# Patient Record
Sex: Female | Born: 1945
Health system: Southern US, Community
[De-identification: ages and names within clinical notes are randomized; demographics above are authoritative.]

## PROBLEM LIST (undated history)

## (undated) DIAGNOSIS — K219 Gastro-esophageal reflux disease without esophagitis: Secondary | ICD-10-CM

## (undated) DIAGNOSIS — H9190 Unspecified hearing loss, unspecified ear: Secondary | ICD-10-CM

## (undated) DIAGNOSIS — M1990 Unspecified osteoarthritis, unspecified site: Secondary | ICD-10-CM

## (undated) DIAGNOSIS — I10 Essential (primary) hypertension: Secondary | ICD-10-CM

## (undated) DIAGNOSIS — J45909 Unspecified asthma, uncomplicated: Secondary | ICD-10-CM

## (undated) HISTORY — PX: EYE SURGERY: SHX253

## (undated) HISTORY — PX: GANGLION CYST EXCISION: SHX1691

## (undated) HISTORY — PX: FOOT SURGERY: SHX648

## (undated) HISTORY — PX: CHEST SURGERY: SHX595

---

## 1998-01-16 ENCOUNTER — Ambulatory Visit (HOSPITAL_COMMUNITY): Admission: RE | Admit: 1998-01-16 | Discharge: 1998-01-16 | Payer: Self-pay | Admitting: Obstetrics & Gynecology

## 1998-01-16 ENCOUNTER — Encounter: Admission: RE | Admit: 1998-01-16 | Discharge: 1998-01-16 | Payer: Self-pay | Admitting: Obstetrics & Gynecology

## 1998-05-08 ENCOUNTER — Encounter: Admission: RE | Admit: 1998-05-08 | Discharge: 1998-05-08 | Payer: Self-pay | Admitting: Obstetrics & Gynecology

## 1998-05-08 ENCOUNTER — Other Ambulatory Visit: Admission: RE | Admit: 1998-05-08 | Discharge: 1998-05-08 | Payer: Self-pay | Admitting: Obstetrics & Gynecology

## 2004-08-20 ENCOUNTER — Ambulatory Visit: Payer: Self-pay | Admitting: Internal Medicine

## 2005-08-22 ENCOUNTER — Ambulatory Visit: Payer: Self-pay | Admitting: Internal Medicine

## 2006-05-28 ENCOUNTER — Ambulatory Visit: Payer: Self-pay | Admitting: Internal Medicine

## 2006-08-26 ENCOUNTER — Ambulatory Visit: Payer: Self-pay | Admitting: Internal Medicine

## 2006-12-15 ENCOUNTER — Emergency Department: Payer: Self-pay | Admitting: Unknown Physician Specialty

## 2008-03-16 ENCOUNTER — Ambulatory Visit: Payer: Self-pay | Admitting: Internal Medicine

## 2008-06-12 ENCOUNTER — Ambulatory Visit: Payer: Self-pay | Admitting: Gastroenterology

## 2009-04-29 ENCOUNTER — Emergency Department: Payer: Self-pay | Admitting: Emergency Medicine

## 2009-10-16 ENCOUNTER — Ambulatory Visit: Payer: Self-pay | Admitting: Internal Medicine

## 2009-10-29 ENCOUNTER — Ambulatory Visit: Payer: Self-pay | Admitting: Internal Medicine

## 2012-03-11 ENCOUNTER — Emergency Department: Payer: Self-pay | Admitting: Internal Medicine

## 2012-03-11 IMAGING — CR DG LUMBAR SPINE 2-3V
1 series · 3 of 3 positions shown · non-contrast
Comparison: none

REASON FOR EXAM: R leg pain
COMMENTS:   LMP: Post-Menopausal

[Series 1: t lumbar spine ap · 0.14mm/px · 3 of 3 slices shown]
[im 1/3]
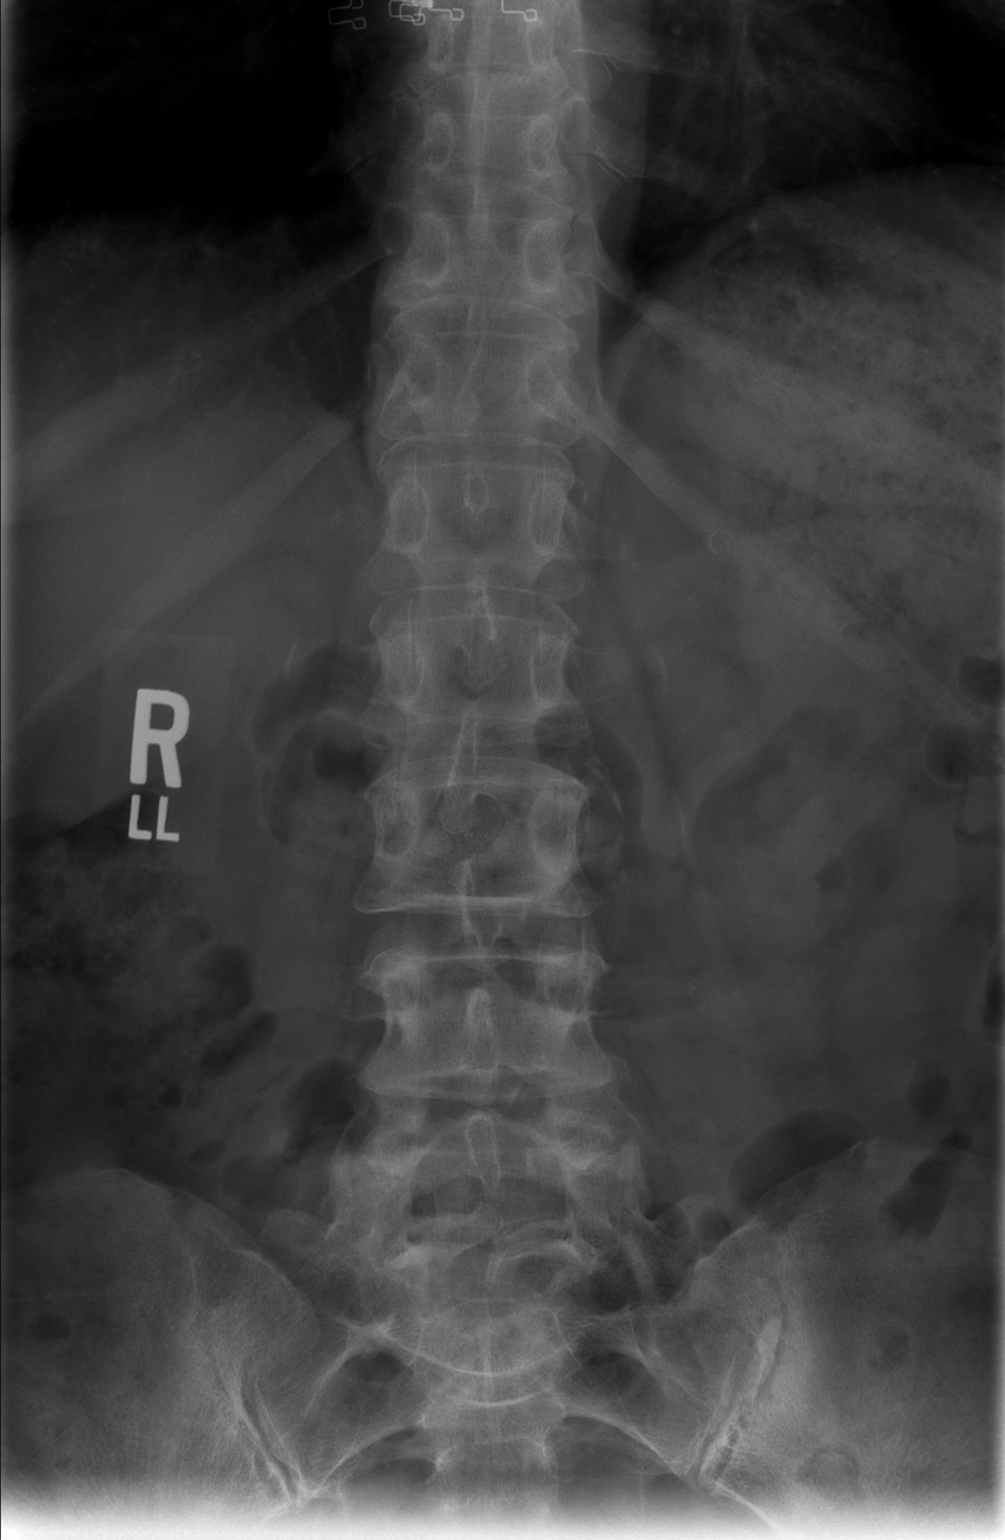
[im 2/3]
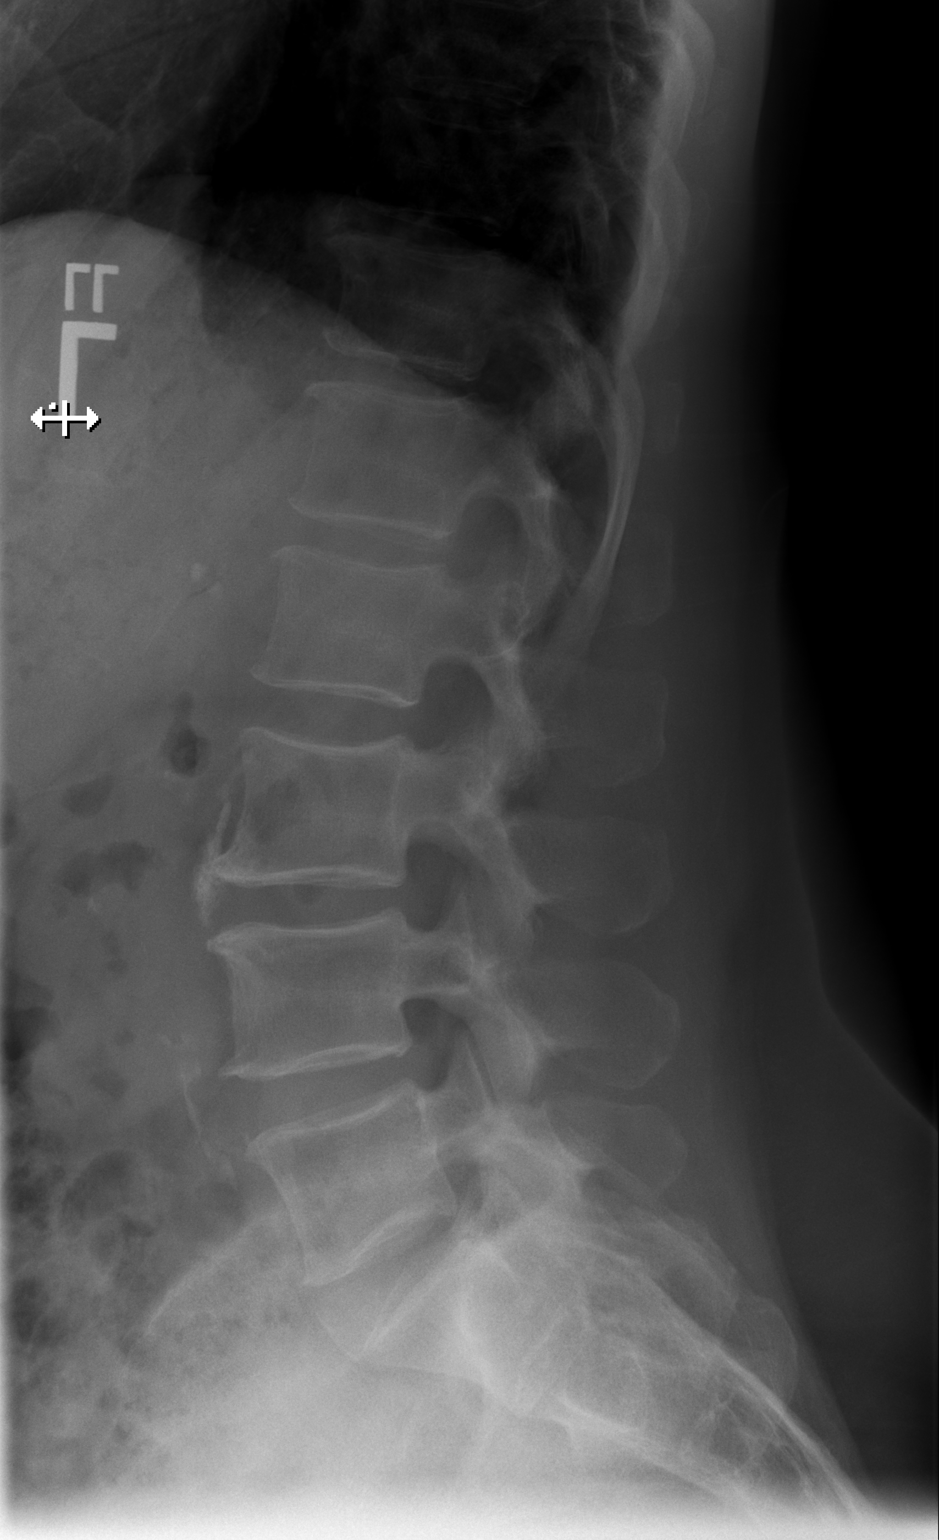
[im 3/3]
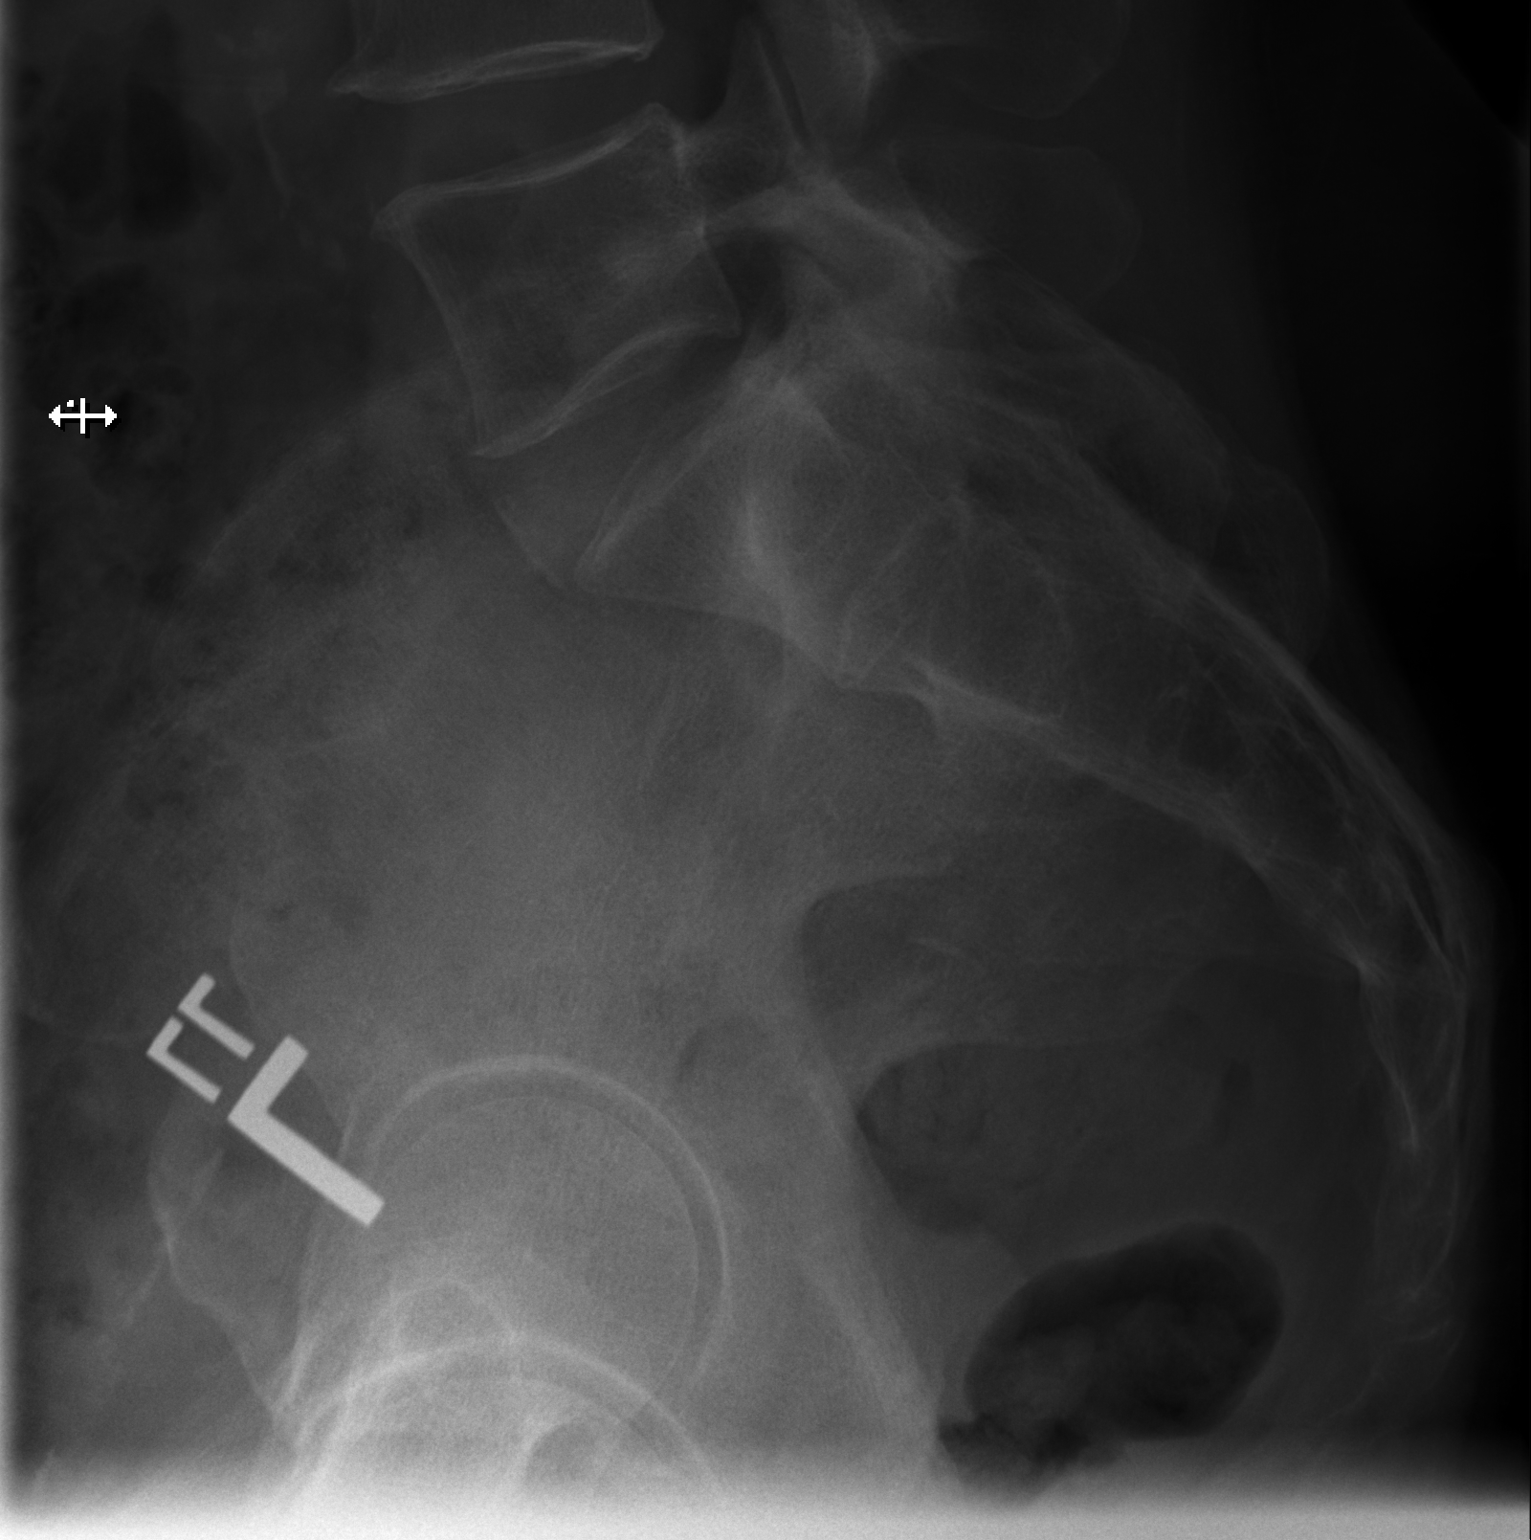

[3 of 3 positions shown; findings below may reference images not displayed]

PROCEDURE:     DXR - DXR LUMBAR SPINE AP AND LATERAL  - [DATE]  [DATE]

RESULT:     Lumbar spine images in the AP and lateral projections
demonstrate preservation of the vertebral body heights and intervertebral
disc spaces. The facets appear normally aligned. Atherosclerotic
calcification is noted in the aorta. Minimal degenerative endplate spurring
is seen at L3-L4 and L4-L5.
IMPRESSION: Mild degenerative change. No acute bony abnormality.

[REDACTED]

## 2012-03-22 ENCOUNTER — Ambulatory Visit: Payer: Self-pay | Admitting: Family Medicine

## 2012-09-07 ENCOUNTER — Ambulatory Visit: Payer: Self-pay | Admitting: Ophthalmology

## 2012-09-07 LAB — POTASSIUM: Potassium: 3.6 mmol/L (ref 3.5–5.1)

## 2012-09-15 ENCOUNTER — Ambulatory Visit: Payer: Self-pay | Admitting: Ophthalmology

## 2014-06-07 DIAGNOSIS — I10 Essential (primary) hypertension: Secondary | ICD-10-CM | POA: Diagnosis not present

## 2014-06-07 DIAGNOSIS — T148 Other injury of unspecified body region: Secondary | ICD-10-CM | POA: Diagnosis not present

## 2014-06-29 ENCOUNTER — Ambulatory Visit
Admit: 2014-06-29 | Disposition: A | Discharge status: Home / Self Care | Payer: Self-pay | Attending: Family Medicine | Admitting: Family Medicine

## 2014-06-29 DIAGNOSIS — Z1231 Encounter for screening mammogram for malignant neoplasm of breast: Secondary | ICD-10-CM | POA: Diagnosis not present

## 2014-07-14 NOTE — Op Note (Signed)
PATIENT NAME:  Holly Reyes, Holly Reyes MR#:  [id number] DATE OF BIRTH:  April 06, 1945  DATE OF PROCEDURE:  09/15/2012  PREOPERATIVE DIAGNOSIS:  Cataract, right eye.   POSTOPERATIVE DIAGNOSIS:  Cataract, right eye.  PROCEDURE PERFORMED:  Extracapsular cataract extraction using phacoemulsification with placement of an Alcon [id number], 20.5-diopter posterior chamber lens, serial # [id number].  SURGEON:  Loura Back. Roneshia Drew, MD  ASSISTANT:  None.  ANESTHESIA:  4% lidocaine and 0.75% Marcaine in a 50/50 mixture with 10 units per mL of Hylenex added, given as a peribulbar.  ANESTHESIOLOGIST:  Dr. Marcello Moores  COMPLICATIONS:  None  ESTIMATED BLOOD LOSS:  Less than 1 ml.  DESCRIPTION OF PROCEDURE:  The patient was brought to the operating room and given a peribulbar block.  The patient was then prepped and draped in the usual fashion.  The vertical rectus muscles were imbricated using 5-0 silk sutures.  These sutures were then clamped to the sterile drapes as bridle sutures.  A limbal peritomy was performed extending two clock hours and hemostasis was obtained with cautery.  A partial thickness scleral groove was made at the surgical limbus and dissected anteriorly in a lamellar dissection using an Alcon crescent knife.  The anterior chamber was entered superonasally with a Superblade and through the lamellar dissection with a 2.6 mm keratome.  DisCoVisc was used to replace the aqueous and a continuous tear capsulorrhexis was carried out.  Hydrodissection and hydrodelineation were carried out with balanced salt and a 27 gauge canula.  The nucleus was rotated to confirm the effectiveness of the hydrodissection.  Phacoemulsification was carried out using a divide-and-conquer technique.  Total ultrasound time was 1 minute and 22 seconds with an average power of 24.3%, CDE of 34.02.    Irrigation/aspiration was used to remove the residual cortex.  DisCoVisc was used to inflate the capsule and the internal incision was  enlarged to 3 mm with the crescent knife.  The intraocular lens was folded and inserted into the capsular bag using the AcrySert Delivery System.  Irrigation/aspiration was used to remove the residual DisCoVisc.  Miostat was injected into the anterior chamber through the paracentesis track to inflate the anterior chamber and induce miosis.  The wound was checked for leaks and none were found. The conjunctiva was closed with cautery and the bridle sutures were removed.  Two drops of 0.3% Vigamox were placed on the eye.   An eye shield was placed on the eye.  The patient was discharged to the recovery room in good condition.    ____________________________ Loura Back Mariaceleste Herrera, MD sad:ce D: 09/15/2012 11:40:37 ET T: 09/15/2012 12:50:02 ET JOB#: [id number]  cc: Remo Lipps A. Sumeya Yontz, MD, <Dictator> Martie Lee MD ELECTRONICALLY SIGNED 09/20/2012 13:52

## 2014-07-31 DIAGNOSIS — J069 Acute upper respiratory infection, unspecified: Secondary | ICD-10-CM | POA: Diagnosis not present

## 2014-07-31 DIAGNOSIS — I10 Essential (primary) hypertension: Secondary | ICD-10-CM | POA: Diagnosis not present

## 2015-02-12 DIAGNOSIS — E785 Hyperlipidemia, unspecified: Secondary | ICD-10-CM | POA: Diagnosis not present

## 2015-02-12 DIAGNOSIS — J309 Allergic rhinitis, unspecified: Secondary | ICD-10-CM | POA: Diagnosis not present

## 2015-02-12 DIAGNOSIS — Z23 Encounter for immunization: Secondary | ICD-10-CM | POA: Diagnosis not present

## 2015-02-12 DIAGNOSIS — I10 Essential (primary) hypertension: Secondary | ICD-10-CM | POA: Diagnosis not present

## 2016-04-08 ENCOUNTER — Other Ambulatory Visit: Payer: Self-pay | Admitting: Family Medicine

## 2016-04-08 DIAGNOSIS — Z0000 Encounter for general adult medical examination without abnormal findings: Secondary | ICD-10-CM

## 2016-04-08 DIAGNOSIS — Z1239 Encounter for other screening for malignant neoplasm of breast: Secondary | ICD-10-CM

## 2016-06-03 DIAGNOSIS — K635 Polyp of colon: Secondary | ICD-10-CM | POA: Insufficient documentation

## 2016-07-03 ENCOUNTER — Encounter: Payer: Self-pay | Admitting: *Deleted

## 2016-07-08 NOTE — H&P (Signed)
See scanned note.

## 2016-07-09 ENCOUNTER — Ambulatory Visit
Admission: RE | Admit: 2016-07-09 | Discharge: 2016-07-09 | Disposition: A | Discharge status: Home / Self Care | Payer: Medicare Other | Source: Ambulatory Visit | Attending: Ophthalmology | Admitting: Ophthalmology

## 2016-07-09 ENCOUNTER — Ambulatory Visit: Payer: Medicare Other | Admitting: Anesthesiology

## 2016-07-09 ENCOUNTER — Encounter: Payer: Self-pay | Admitting: *Deleted

## 2016-07-09 ENCOUNTER — Encounter
Admission: RE | Disposition: A | Discharge status: Home / Self Care | Payer: Self-pay | Source: Ambulatory Visit | Attending: Ophthalmology

## 2016-07-09 DIAGNOSIS — I10 Essential (primary) hypertension: Secondary | ICD-10-CM | POA: Insufficient documentation

## 2016-07-09 DIAGNOSIS — F17200 Nicotine dependence, unspecified, uncomplicated: Secondary | ICD-10-CM | POA: Diagnosis not present

## 2016-07-09 DIAGNOSIS — Z79899 Other long term (current) drug therapy: Secondary | ICD-10-CM | POA: Diagnosis not present

## 2016-07-09 DIAGNOSIS — J45909 Unspecified asthma, uncomplicated: Secondary | ICD-10-CM | POA: Insufficient documentation

## 2016-07-09 DIAGNOSIS — M1990 Unspecified osteoarthritis, unspecified site: Secondary | ICD-10-CM | POA: Diagnosis not present

## 2016-07-09 DIAGNOSIS — H25042 Posterior subcapsular polar age-related cataract, left eye: Secondary | ICD-10-CM | POA: Insufficient documentation

## 2016-07-09 DIAGNOSIS — K219 Gastro-esophageal reflux disease without esophagitis: Secondary | ICD-10-CM | POA: Insufficient documentation

## 2016-07-09 HISTORY — DX: Gastro-esophageal reflux disease without esophagitis: K21.9

## 2016-07-09 HISTORY — PX: CATARACT EXTRACTION W/PHACO: SHX586

## 2016-07-09 HISTORY — DX: Essential (primary) hypertension: I10

## 2016-07-09 HISTORY — DX: Unspecified asthma, uncomplicated: J45.909

## 2016-07-09 HISTORY — DX: Unspecified osteoarthritis, unspecified site: M19.90

## 2016-07-09 HISTORY — DX: Unspecified hearing loss, unspecified ear: H91.90

## 2016-07-09 SURGERY — PHACOEMULSIFICATION, CATARACT, WITH IOL INSERTION
Anesthesia: General | Site: Eye | Laterality: Left | Wound class: Clean

## 2016-07-09 MED ORDER — FENTANYL CITRATE (PF) 100 MCG/2ML IJ SOLN: 25.0000 ug | INTRAMUSCULAR | Status: DC | PRN

## 2016-07-09 MED ORDER — MIDAZOLAM HCL 2 MG/2ML IJ SOLN
INTRAMUSCULAR | Status: AC
  Filled 2016-07-09: qty 2

## 2016-07-09 MED ORDER — TRYPAN BLUE 0.06 % OP SOLN
OPHTHALMIC | Status: DC | PRN
  Administered 2016-07-09: 0.5 mL via INTRAOCULAR

## 2016-07-09 MED ORDER — CYCLOPENTOLATE HCL 2 % OP SOLN
1.0000 [drp] | OPHTHALMIC | Status: AC
  Administered 2016-07-09 (×4): 1 [drp] via OPHTHALMIC

## 2016-07-09 MED ORDER — MOXIFLOXACIN HCL 0.5 % OP SOLN
OPHTHALMIC | Status: AC
  Filled 2016-07-09: qty 3

## 2016-07-09 MED ORDER — CYCLOPENTOLATE HCL 2 % OP SOLN
OPHTHALMIC | Status: AC
  Administered 2016-07-09: 1 [drp] via OPHTHALMIC
  Filled 2016-07-09: qty 2

## 2016-07-09 MED ORDER — TETRACAINE HCL 0.5 % OP SOLN
OPHTHALMIC | Status: DC | PRN
  Administered 2016-07-09: 2 [drp] via OPHTHALMIC

## 2016-07-09 MED ORDER — MOXIFLOXACIN HCL 0.5 % OP SOLN
OPHTHALMIC | Status: AC
Start: 2016-07-09 — End: 2016-07-09
  Administered 2016-07-09: 1 [drp] via OPHTHALMIC
  Filled 2016-07-09: qty 3

## 2016-07-09 MED ORDER — TRYPAN BLUE 0.06 % OP SOLN
OPHTHALMIC | Status: AC
  Filled 2016-07-09: qty 0.5

## 2016-07-09 MED ORDER — MIDAZOLAM HCL 2 MG/2ML IJ SOLN
INTRAMUSCULAR | Status: DC | PRN
  Administered 2016-07-09: 0.5 mg via INTRAVENOUS
  Administered 2016-07-09: 1 mg via INTRAVENOUS

## 2016-07-09 MED ORDER — MOXIFLOXACIN HCL 0.5 % OP SOLN
OPHTHALMIC | Status: DC | PRN
  Administered 2016-07-09: 0.2 mL via OPHTHALMIC

## 2016-07-09 MED ORDER — PHENYLEPHRINE HCL 10 % OP SOLN
1.0000 [drp] | OPHTHALMIC | Status: AC
  Administered 2016-07-09 (×4): 1 [drp] via OPHTHALMIC

## 2016-07-09 MED ORDER — PHENYLEPHRINE HCL 10 % OP SOLN
OPHTHALMIC | Status: AC
Start: 2016-07-09 — End: 2016-07-09
  Administered 2016-07-09: 1 [drp] via OPHTHALMIC
  Filled 2016-07-09: qty 5

## 2016-07-09 MED ORDER — POVIDONE-IODINE 5 % OP SOLN
OPHTHALMIC | Status: DC | PRN
  Administered 2016-07-09: 1 via OPHTHALMIC

## 2016-07-09 MED ORDER — SODIUM CHLORIDE 0.9 % IV SOLN
INTRAVENOUS | Status: DC
  Administered 2016-07-09 (×2): via INTRAVENOUS

## 2016-07-09 MED ORDER — ALFENTANIL 500 MCG/ML IJ INJ
INJECTION | INTRAMUSCULAR | Status: DC | PRN
  Administered 2016-07-09: 500 ug via INTRAVENOUS

## 2016-07-09 MED ORDER — LIDOCAINE HCL (PF) 4 % IJ SOLN
INTRAMUSCULAR | Status: DC | PRN
  Administered 2016-07-09: 5 mL via OPHTHALMIC

## 2016-07-09 MED ORDER — LIDOCAINE HCL (PF) 4 % IJ SOLN
INTRAOCULAR | Status: DC | PRN
  Administered 2016-07-09: 4 mL via OPHTHALMIC

## 2016-07-09 MED ORDER — NA CHONDROIT SULF-NA HYALURON 40-17 MG/ML IO SOLN
INTRAOCULAR | Status: DC | PRN
  Administered 2016-07-09: 1 mL via INTRAOCULAR

## 2016-07-09 MED ORDER — EPINEPHRINE PF 1 MG/ML IJ SOLN
INTRAOCULAR | Status: DC | PRN
  Administered 2016-07-09: 200 mL via OPHTHALMIC

## 2016-07-09 MED ORDER — ONDANSETRON HCL 4 MG/2ML IJ SOLN: 4.0000 mg | Freq: Once | INTRAMUSCULAR | Status: DC | PRN

## 2016-07-09 MED ORDER — MOXIFLOXACIN HCL 0.5 % OP SOLN
1.0000 [drp] | OPHTHALMIC | Status: AC
  Administered 2016-07-09 (×3): 1 [drp] via OPHTHALMIC

## 2016-07-09 SURGICAL SUPPLY — 32 items
CANNULA ANT/CHMB 27G (MISCELLANEOUS) ×1 IMPLANT
CANNULA ANT/CHMB 27GA (MISCELLANEOUS) ×9 IMPLANT
CORD BIP STRL DISP 12FT (MISCELLANEOUS) ×3 IMPLANT
CUP MEDICINE 2OZ PLAST GRAD ST (MISCELLANEOUS) ×3 IMPLANT
DRAPE XRAY CASSETTE 23X24 (DRAPES) ×3 IMPLANT
ERASER HMR WETFIELD 18G (MISCELLANEOUS) ×3 IMPLANT
FILTER MILLEX .045 (MISCELLANEOUS) IMPLANT
FLTR MILLEX .045 (MISCELLANEOUS) ×3
GLOVE BIO SURGEON STRL SZ8 (GLOVE) ×3 IMPLANT
GLOVE SURG LX 6.5 MICRO (GLOVE) ×2
GLOVE SURG LX 8.0 MICRO (GLOVE) ×2
GLOVE SURG LX STRL 6.5 MICRO (GLOVE) ×1 IMPLANT
GLOVE SURG LX STRL 8.0 MICRO (GLOVE) ×1 IMPLANT
GOWN STRL REUS W/ TWL LRG LVL3 (GOWN DISPOSABLE) ×1 IMPLANT
GOWN STRL REUS W/ TWL XL LVL3 (GOWN DISPOSABLE) ×1 IMPLANT
GOWN STRL REUS W/TWL LRG LVL3 (GOWN DISPOSABLE) ×3
GOWN STRL REUS W/TWL XL LVL3 (GOWN DISPOSABLE) ×3
LENS IOL ACRYSOF IQ 21.0 (Intraocular Lens) ×2 IMPLANT
PACK CATARACT (MISCELLANEOUS) ×3 IMPLANT
PACK CATARACT DINGLEDEIN LX (MISCELLANEOUS) ×3 IMPLANT
PACK EYE AFTER SURG (MISCELLANEOUS) ×3 IMPLANT
SHLD EYE VISITEC  UNIV (MISCELLANEOUS) ×3 IMPLANT
SOL BSS BAG (MISCELLANEOUS) ×3
SOL PREP PVP 2OZ (MISCELLANEOUS) ×3
SOLUTION BSS BAG (MISCELLANEOUS) ×1 IMPLANT
SOLUTION PREP PVP 2OZ (MISCELLANEOUS) ×1 IMPLANT
SUT SILK 5-0 (SUTURE) ×3 IMPLANT
SYR 3ML LL SCALE MARK (SYRINGE) ×5 IMPLANT
SYR 5ML LL (SYRINGE) ×3 IMPLANT
SYR TB 1ML 27GX1/2 LL (SYRINGE) ×3 IMPLANT
WATER STERILE IRR 250ML POUR (IV SOLUTION) ×3 IMPLANT
WIPE NON LINTING 3.25X3.25 (MISCELLANEOUS) ×3 IMPLANT

## 2016-07-09 NOTE — Transfer of Care (Signed)
Immediate Anesthesia Transfer of Care Note  Patient: Holly Reyes  Procedure(s) Performed: Procedure(s) with comments: CATARACT EXTRACTION PHACO AND INTRAOCULAR LENS PLACEMENT (IOC) (Left) - Lot# [id number] H [place]: 01:58.7 AP%: 24.0 CDE: 48.83  Patient Location: PACU and Short Stay  Anesthesia Type:MAC  Level of Consciousness: awake, oriented and patient cooperative  Airway & Oxygen Therapy: Patient Spontanous Breathing  Post-op Assessment: Report given to RN and Post -op Vital signs reviewed and stable  Post vital signs: Reviewed and stable  Last Vitals:  Vitals:   07/09/16 0755  BP: (!) 184/77  Pulse: 66  Resp: 18  Temp: 36.2 C    Last Pain:  Vitals:   07/09/16 0755  TempSrc: Tympanic         Complications: No apparent anesthesia complications

## 2016-07-09 NOTE — Anesthesia Preprocedure Evaluation (Signed)
Anesthesia Evaluation  Patient identified by MRN, date of birth, ID band Patient awake    Reviewed: Allergy & Precautions, NPO status , Patient's Chart, lab work & pertinent test results  Airway Mallampati: II  TM Distance: >3 FB     Dental   Pulmonary asthma , Current Smoker,    Pulmonary exam normal        Cardiovascular hypertension, Pt. on medications Normal cardiovascular exam     Neuro/Psych negative neurological ROS  negative psych ROS   GI/Hepatic Neg liver ROS, GERD  ,  Endo/Other  negative endocrine ROS  Renal/GU negative Renal ROS  negative genitourinary   Musculoskeletal  (+) Arthritis , Osteoarthritis,    Abdominal Normal abdominal exam  (+)   Peds negative pediatric ROS (+)  Hematology negative hematology ROS (+)   Anesthesia Other Findings   Reproductive/Obstetrics                             Anesthesia Physical Anesthesia Plan  ASA: II  Anesthesia Plan: General   Post-op Pain Management:    Induction: Intravenous  Airway Management Planned: Nasal Cannula  Additional Equipment:   Intra-op Plan:   Post-operative Plan:   Informed Consent: I have reviewed the patients History and Physical, chart, labs and discussed the procedure including the risks, benefits and alternatives for the proposed anesthesia with the patient or authorized representative who has indicated his/her understanding and acceptance.     Plan Discussed with: CRNA and Surgeon  Anesthesia Plan Comments:         Anesthesia Quick Evaluation

## 2016-07-09 NOTE — Anesthesia Post-op Follow-up Note (Cosign Needed)
Anesthesia QCDR form completed.        

## 2016-07-09 NOTE — Op Note (Signed)
Date of Surgery: 07/09/2016 Date of Dictation: 07/09/2016 10:22 AM Pre-operative Diagnosis:Nuclear Sclerotic Cataract, Posterior Subcapsular Cataract and Cortical Cataract left Eye Post-operative Diagnosis: same Procedure performed: Extra-capsular Cataract Extraction (ECCE) with placement of a posterior chamber intraocular lens (IOL) left Eye IOL:  Implant Name Type Inv. Item Serial No. Manufacturer Lot No. LRB No. Used  LENS IOL ACRYSOF IQ 21.0 - [id number] Intraocular Lens LENS IOL ACRYSOF IQ 21.0 [id number] ALCON   Left 1   Anesthesia: 2% Lidocaine and 4% Marcaine in a 50/50 mixture with 10 unites/ml of Hylenex given as a peribulbar Anesthesiologist: Anesthesiologist: Alvin Critchley, MD CRNA: Courtney Paris, CRNA Complications: none Estimated Blood Loss: less than 1 ml  Description of procedure:  The patient was given anesthesia and sedation via intravenous access. The patient was then prepped and draped in the usual fashion. A 25-gauge needle was bent for initiating the capsulorhexis. A 5-0 silk suture was placed through the conjunctiva superior and inferiorly to serve as bridle sutures. Hemostasis was obtained at the superior limbus using an eraser cautery. A partial thickness groove was made at the anterior surgical limbus with a 64 Beaver blade and this was dissected anteriorly with an [employer]. The anterior chamber was entered at 10 o'clock and 2 o'clock with a 1.0 mm paracentesis knife. Epi-Shugarcaine 0.5 CC [9 cc BSS Plus (Alcon), 3 cc 4% preservative-free lidocaine (Hospira) and 4 cc 1:1000 preservative-free, bisulfite-free epinephrine] was injected into the anterior chamber via the paracentesis tract. Air was introduced via the paracentesis to replace the aqueous and Vision Blue was used to paint the surface of the anterior capsule. The air was then replaced with DiscoVisc.   The anterior chamber was entered through the lamellar dissection with a 2.6 mm Alcon keratome.A  continuous tear curvilinear capsulorhexis was performed using a bent 25-gauge needle.  Balance salt on a syringe was used to perform hydro-dissection and phacoemulsification was carried out using a divide and conquer technique. Procedure(s) with comments: CATARACT EXTRACTION PHACO AND INTRAOCULAR LENS PLACEMENT (IOC) (Left) - Lot# [id number] H [place]: 01:58.7 AP%: 24.0 CDE: 48.83. Irrigation/aspiration was used to remove the residual cortex and the capsular bag was inflated with DiscoVisc. Irrigation/aspiration was used to remove the residual cortex and the capsular bag was inflated with DiscoVisc. The intraocular lens was inserted into the capsular bag using a pre-loaded UltraSert Delivery System. Irrigation/aspiration was used to remove the residual DiscoVisc. The wound was inflated with balanced salt and checked for leaks. None were found. Miostat was injected via the paracentesis track and 0.1 ml of Vigamox containing 1 mg of drug  was injected via the paracentesis track. The wound was checked for leaks again and none were found.   The bridal sutures were removed and two drops of Vigamox were placed on the eye. An eye shield was placed to protect the eye and the patient was discharged to the recovery area in good condition.   Cloee Dunwoody MD

## 2016-07-09 NOTE — Interval H&P Note (Signed)
History and Physical Interval Note:  07/09/2016 9:31 AM  Holly Reyes  has presented today for surgery, with the diagnosis of CATARACT  The various methods of treatment have been discussed with the patient and family. After consideration of risks, benefits and other options for treatment, the patient has consented to  Procedure(s): CATARACT EXTRACTION PHACO AND INTRAOCULAR LENS PLACEMENT ([place]) (Left) as a surgical intervention .  The patient's history has been reviewed, patient examined, no change in status, stable for surgery.  I have reviewed the patient's chart and labs.  Questions were answered to the patient's satisfaction.     Joby Hershkowitz

## 2016-07-09 NOTE — Discharge Instructions (Signed)
Eye Surgery Discharge Instructions  Expect mild scratchy sensation or mild soreness. DO NOT RUB YOUR EYE!  The day of surgery:  Minimal physical activity, but bed rest is not required  No reading, computer work, or close hand work  No bending, lifting, or straining.  May watch TV  For 24 hours:  No driving, legal decisions, or alcoholic beverages  Safety precautions  Eat anything you prefer: It is better to start with liquids, then soup then solid foods.  _____ Eye patch should be worn until postoperative exam tomorrow.  ____ Solar shield eyeglasses should be worn for comfort in the sunlight/patch while sleeping  Resume all regular medications including aspirin or Coumadin if these were discontinued prior to surgery. You may shower, bathe, shave, or wash your hair. Tylenol may be taken for mild discomfort.  Call your doctor if you experience significant pain, nausea, or vomiting, fever > 101 or other signs of infection. [phone] or [phone] Specific instructions:  Follow-up Information    Trenton Verne, MD Follow up.   Specialty:  Ophthalmology Why:  April 19 at 10:40am Contact information: [address]   [place] [address] [phone]

## 2016-07-10 NOTE — Anesthesia Postprocedure Evaluation (Signed)
Anesthesia Post Note  Patient: Holly Reyes  Procedure(s) Performed: Procedure(s) (LRB): CATARACT EXTRACTION PHACO AND INTRAOCULAR LENS PLACEMENT (IOC) (Left)  Patient location during evaluation: PACU Anesthesia Type: General Level of consciousness: awake and alert and oriented Pain management: pain level controlled Vital Signs Assessment: post-procedure vital signs reviewed and stable Respiratory status: spontaneous breathing Cardiovascular status: blood pressure returned to baseline Anesthetic complications: no     Last Vitals:  Vitals:   07/09/16 1022 07/09/16 1025  BP: (!) 175/70 (!) 167/68  Pulse: 74 73  Resp:  18  Temp: 36.3 C 36.3 C    Last Pain:  Vitals:   07/10/16 0847  TempSrc:   PainSc: 0-No pain                 Janiene Aarons

## 2017-02-02 ENCOUNTER — Ambulatory Visit: Payer: Medicare Other | Admitting: Anesthesiology

## 2017-02-02 ENCOUNTER — Encounter
Admission: RE | Disposition: A | Discharge status: Home / Self Care | Payer: Self-pay | Source: Ambulatory Visit | Attending: Gastroenterology

## 2017-02-02 ENCOUNTER — Encounter: Payer: Self-pay | Admitting: *Deleted

## 2017-02-02 ENCOUNTER — Ambulatory Visit
Admission: RE | Admit: 2017-02-02 | Discharge: 2017-02-02 | Disposition: A | Discharge status: Home / Self Care | Payer: Medicare Other | Source: Ambulatory Visit | Attending: Gastroenterology | Admitting: Gastroenterology

## 2017-02-02 DIAGNOSIS — J45909 Unspecified asthma, uncomplicated: Secondary | ICD-10-CM | POA: Insufficient documentation

## 2017-02-02 DIAGNOSIS — K640 First degree hemorrhoids: Secondary | ICD-10-CM | POA: Insufficient documentation

## 2017-02-02 DIAGNOSIS — D125 Benign neoplasm of sigmoid colon: Secondary | ICD-10-CM | POA: Diagnosis not present

## 2017-02-02 DIAGNOSIS — Z1211 Encounter for screening for malignant neoplasm of colon: Secondary | ICD-10-CM | POA: Insufficient documentation

## 2017-02-02 DIAGNOSIS — K635 Polyp of colon: Secondary | ICD-10-CM | POA: Diagnosis not present

## 2017-02-02 DIAGNOSIS — K5730 Diverticulosis of large intestine without perforation or abscess without bleeding: Secondary | ICD-10-CM | POA: Diagnosis not present

## 2017-02-02 DIAGNOSIS — K219 Gastro-esophageal reflux disease without esophagitis: Secondary | ICD-10-CM | POA: Insufficient documentation

## 2017-02-02 DIAGNOSIS — Z79899 Other long term (current) drug therapy: Secondary | ICD-10-CM | POA: Insufficient documentation

## 2017-02-02 DIAGNOSIS — I10 Essential (primary) hypertension: Secondary | ICD-10-CM | POA: Diagnosis not present

## 2017-02-02 DIAGNOSIS — M1990 Unspecified osteoarthritis, unspecified site: Secondary | ICD-10-CM | POA: Diagnosis not present

## 2017-02-02 HISTORY — PX: COLONOSCOPY WITH PROPOFOL: SHX5780

## 2017-02-02 SURGERY — COLONOSCOPY WITH PROPOFOL
Anesthesia: General

## 2017-02-02 MED ORDER — SODIUM CHLORIDE 0.9 % IV SOLN: INTRAVENOUS | Status: DC

## 2017-02-02 MED ORDER — PROPOFOL 10 MG/ML IV BOLUS
INTRAVENOUS | Status: DC | PRN
  Administered 2017-02-02: 70 mg via INTRAVENOUS
  Administered 2017-02-02: 30 mg via INTRAVENOUS

## 2017-02-02 MED ORDER — PROPOFOL 10 MG/ML IV BOLUS
INTRAVENOUS | Status: AC
  Filled 2017-02-02: qty 40

## 2017-02-02 MED ORDER — IPRATROPIUM-ALBUTEROL 0.5-2.5 (3) MG/3ML IN SOLN
RESPIRATORY_TRACT | Status: AC
  Administered 2017-02-02: 3 mL via RESPIRATORY_TRACT
  Filled 2017-02-02: qty 3

## 2017-02-02 MED ORDER — LIDOCAINE HCL (CARDIAC) 20 MG/ML IV SOLN
INTRAVENOUS | Status: DC | PRN
  Administered 2017-02-02: 50 mg via INTRAVENOUS

## 2017-02-02 MED ORDER — IPRATROPIUM-ALBUTEROL 0.5-2.5 (3) MG/3ML IN SOLN
3.0000 mL | Freq: Four times a day (QID) | RESPIRATORY_TRACT | Status: DC | PRN
  Administered 2017-02-02: 3 mL via RESPIRATORY_TRACT

## 2017-02-02 MED ORDER — PROPOFOL 10 MG/ML IV BOLUS
INTRAVENOUS | Status: AC
  Filled 2017-02-02: qty 20

## 2017-02-02 MED ORDER — PROPOFOL 500 MG/50ML IV EMUL
INTRAVENOUS | Status: DC | PRN
  Administered 2017-02-02: 75 ug/kg/min via INTRAVENOUS

## 2017-02-02 MED ORDER — LIDOCAINE HCL (PF) 2 % IJ SOLN
INTRAMUSCULAR | Status: AC
  Filled 2017-02-02: qty 10

## 2017-02-02 MED ORDER — SODIUM CHLORIDE 0.9 % IV SOLN
INTRAVENOUS | Status: DC
  Administered 2017-02-02: 1000 mL via INTRAVENOUS

## 2017-02-02 NOTE — Anesthesia Preprocedure Evaluation (Signed)
Anesthesia Evaluation  Patient identified by MRN, date of birth, ID band Patient awake    Reviewed: Allergy & Precautions, NPO status , Patient's Chart, lab work & pertinent test results  History of Anesthesia Complications Negative for: history of anesthetic complications  Airway Mallampati: II  TM Distance: >3 FB Neck ROM: Full    Dental no notable dental hx.    Pulmonary neg sleep apnea, neg COPD, Current Smoker,    breath sounds clear to auscultation- rhonchi (-) wheezing      Cardiovascular Exercise Tolerance: Good hypertension, Pt. on medications (-) CAD, (-) Past MI and (-) Cardiac Stents  Rhythm:Regular Rate:Normal - Systolic murmurs and - Diastolic murmurs    Neuro/Psych negative neurological ROS  negative psych ROS   GI/Hepatic Neg liver ROS, GERD  ,  Endo/Other  negative endocrine ROSneg diabetes  Renal/GU negative Renal ROS     Musculoskeletal  (+) Arthritis ,   Abdominal (+) + obese,   Peds  Hematology negative hematology ROS (+)   Anesthesia Other Findings Past Medical History: No date: Arthritis No date: Asthma No date: GERD (gastroesophageal reflux disease) No date: HOH (hard of hearing) No date: Hypertension   Reproductive/Obstetrics                             Anesthesia Physical Anesthesia Plan  ASA: II  Anesthesia Plan: General   Post-op Pain Management:    Induction: Intravenous  PONV Risk Score and Plan: 2 and Propofol infusion  Airway Management Planned: Natural Airway  Additional Equipment:   Intra-op Plan:   Post-operative Plan:   Informed Consent: I have reviewed the patients History and Physical, chart, labs and discussed the procedure including the risks, benefits and alternatives for the proposed anesthesia with the patient or authorized representative who has indicated his/her understanding and acceptance.   Dental advisory given  Plan  Discussed with: CRNA and Anesthesiologist  Anesthesia Plan Comments:         Anesthesia Quick Evaluation

## 2017-02-02 NOTE — H&P (Signed)
Outpatient short stay form Pre-procedure 02/02/2017 2:39 PM Lollie Sails MD  Primary Physician: [place]  Reason for visit:  Colonoscopy  History of present illness:  Patient is a 71 year old female presenting today as above. This for a screening colonoscopy. Her last colonoscopy was about 2010. She has no problems with rectal bleeding abdominal pain or diarrhea. She tolerated her prep well. She takes no aspirin or blood thinning agents.    Current Facility-Administered Medications:  .  0.9 %  sodium chloride infusion, , Intravenous, Continuous, Lollie Sails, MD, Last Rate: 20 mL/hr at 02/02/17 1309, 1,000 mL at 02/02/17 1309 .  0.9 %  sodium chloride infusion, , Intravenous, Continuous, Lollie Sails, MD .  ipratropium-albuterol (DUONEB) 0.5-2.5 (3) MG/3ML nebulizer solution 3 mL, 3 mL, Nebulization, Q6H PRN, [place], Amy, MD, 3 mL at 02/02/17 1335  Medications Prior to Admission  Medication Sig Dispense Refill Last Dose  . AMLODIPINE BESYLATE PO Take 10 mg by mouth daily.   Past Week at Unknown time  . atorvastatin (LIPITOR) 20 MG tablet Take 20 mg by mouth daily.   Past Week at Unknown time  . losartan (COZAAR) 100 MG tablet Take 100 mg by mouth daily.   Past Week at Unknown time     Allergies  Allergen Reactions  . Penicillins Rash     Past Medical History:  Diagnosis Date  . Arthritis   . Asthma   . GERD (gastroesophageal reflux disease)   . HOH (hard of hearing)   . Hypertension     Review of systems:      Physical Exam    Heart and lungs: Regular rate and rhythm without rub or gallop, lungs are bilaterally clear.    HEENT: Normocephalic atraumatic eyes are anicteric    Other:     Pertinant exam for procedure: Soft nontender nondistended bowel sounds positive normoactive.    Planned proceedures: Colonoscopy and indicated procedures. I have discussed the risks benefits and complications of procedures to include  not limited to bleeding, infection, perforation and the risk of sedation and the patient wishes to proceed.    Lollie Sails, MD Gastroenterology 02/02/2017  2:39 PM

## 2017-02-02 NOTE — Anesthesia Procedure Notes (Signed)
Date/Time: 02/02/2017 3:13 PM Performed by: Johnna Acosta, CRNA Pre-anesthesia Checklist: Patient identified, Emergency Drugs available, Suction available, Patient being monitored and Timeout performed Patient Re-evaluated:Patient Re-evaluated prior to induction Oxygen Delivery Method: Nasal cannula Preoxygenation: Pre-oxygenation with 100% oxygen

## 2017-02-02 NOTE — Op Note (Addendum)
[facility] Patient Name: Holly Reyes Procedure Date: 02/02/2017 2:44 PM MRN: [id number] Account #: [id number] Date of Birth: September 14, 1945 Admit Type: Outpatient Age: 71 Room: [facility] ENDO ROOM 3 Gender: Female Note Status: Finalized Procedure:            Colonoscopy Indications:          Screening for colorectal malignant neoplasm Providers:            Lollie Sails, MD Referring MD:         Baxter Kail. Rebeca Alert MD, MD (Referring MD) Medicines:            Monitored Anesthesia Care Complications:        No immediate complications. Procedure:            Pre-Anesthesia Assessment:                       - ASA Grade Assessment: II - A patient with mild                        systemic disease.                       After obtaining informed consent, the colonoscope was                        passed under direct vision. Throughout the procedure,                        the patient's blood pressure, pulse, and oxygen                        saturations were monitored continuously. The                        Colonoscope was introduced through the anus and                        advanced to the the cecum, identified by appendiceal                        orifice and ileocecal valve. The colonoscopy was                        unusually difficult due to poor bowel prep. Successful                        completion of the procedure was aided by lavage. The                        patient tolerated the procedure well. The quality of                        the bowel preparation was fair. The quality of the                        bowel preparation was fair except the ascending colon                        was poor. Findings:      A 2 mm polyp was found in the  appendiceal orifice. The polyp was       sessile. The polyp was removed with a cold biopsy forceps. Resection and       retrieval were complete.      Four sessile polyps were found in the sigmoid colon. The polyps  were 1       to 3 mm in size. These polyps were removed with a cold biopsy forceps.       Resection and retrieval were complete.      Multiple medium-mouthed diverticula were found in the sigmoid colon and       descending colon.      The digital rectal exam was normal.      Non-bleeding internal hemorrhoids were found during anoscopy. The       hemorrhoids were small and Grade I (internal hemorrhoids that do not       prolapse). Impression:           - Preparation of the colon was fair.                       - One 2 mm polyp at the appendiceal orifice, removed                        with a cold biopsy forceps. Resected and retrieved.                       - Four 1 to 3 mm polyps in the sigmoid colon, removed                        with a cold biopsy forceps. Resected and retrieved.                       - Diverticulosis in the sigmoid colon and in the                        descending colon. Recommendation:       - Discharge patient to home.                       - Await pathology results.                       - Await pathology results.                       - Telephone GI clinic for pathology results in 1 week. Procedure Code(s):    --- Professional ---                       [phone], Colonoscopy, flexible; with biopsy, single or                        multiple Diagnosis Code(s):    --- Professional ---                       Z12.11, Encounter for screening for malignant neoplasm                        of colon                       D12.1, Benign neoplasm of  appendix                       D12.5, Benign neoplasm of sigmoid colon                       K57.30, Diverticulosis of large intestine without                        perforation or abscess without bleeding CPT copyright 2016 American Medical Association. All rights reserved. The codes documented in this report are preliminary and upon coder review may  be revised to meet current compliance requirements. Lollie Sails,  MD 02/02/2017 3:37:10 PM This report has been signed electronically. Number of Addenda: 0 Note Initiated On: 02/02/2017 2:44 PM Scope Withdrawal Time: 0 hours 20 minutes 12 seconds  Total Procedure Duration: 0 hours 31 minutes 5 seconds       [facility]

## 2017-02-02 NOTE — Anesthesia Post-op Follow-up Note (Signed)
Anesthesia QCDR form completed.        

## 2017-02-02 NOTE — Transfer of Care (Signed)
Immediate Anesthesia Transfer of Care Note  Patient: Edison Nasuti  Procedure(s) Performed: COLONOSCOPY WITH PROPOFOL (N/A )  Patient Location: PACU  Anesthesia Type:General  Level of Consciousness: awake and alert   Airway & Oxygen Therapy: Patient Spontanous Breathing and Patient connected to nasal cannula oxygen  Post-op Assessment: Report given to RN and Post -op Vital signs reviewed and stable  Post vital signs: Reviewed and stable  Last Vitals:  Vitals:   02/02/17 1246 02/02/17 1539  BP: (!) 186/77 130/77  Pulse: 92 78  Resp: 20 18  Temp: 36.9 C (!) 36.1 C  SpO2:  99%    Last Pain:  Vitals:   02/02/17 1539  TempSrc: Tympanic      Patients Stated Pain Goal: 0 (21/58/72 7618)  Complications: No apparent anesthesia complications

## 2017-02-03 ENCOUNTER — Encounter: Payer: Self-pay | Admitting: Gastroenterology

## 2017-02-03 NOTE — Anesthesia Postprocedure Evaluation (Signed)
Anesthesia Post Note  Patient: Holly Reyes  Procedure(s) Performed: COLONOSCOPY WITH PROPOFOL (N/A )  Patient location during evaluation: Endoscopy Anesthesia Type: General Level of consciousness: awake and alert and oriented Pain management: pain level controlled Vital Signs Assessment: post-procedure vital signs reviewed and stable Respiratory status: spontaneous breathing, nonlabored ventilation and respiratory function stable Cardiovascular status: blood pressure returned to baseline and stable Postop Assessment: no signs of nausea or vomiting Anesthetic complications: no     Last Vitals:  Vitals:   02/02/17 1539 02/02/17 1607  BP: 130/77 (!) 177/87  Pulse: 78   Resp: 18   Temp: (!) 36.1 C   SpO2: 99%     Last Pain:  Vitals:   02/02/17 1539  TempSrc: Tympanic                 Sibyl Mikula

## 2017-02-04 LAB — SURGICAL PATHOLOGY

## 2017-11-09 ENCOUNTER — Other Ambulatory Visit: Payer: Self-pay | Admitting: Nurse Practitioner

## 2017-11-09 DIAGNOSIS — E2839 Other primary ovarian failure: Secondary | ICD-10-CM

## 2017-11-09 DIAGNOSIS — Z1231 Encounter for screening mammogram for malignant neoplasm of breast: Secondary | ICD-10-CM

## 2017-12-24 ENCOUNTER — Ambulatory Visit (INDEPENDENT_AMBULATORY_CARE_PROVIDER_SITE_OTHER): Payer: Medicare Other | Admitting: Vascular Surgery

## 2017-12-24 ENCOUNTER — Encounter (INDEPENDENT_AMBULATORY_CARE_PROVIDER_SITE_OTHER): Payer: Self-pay | Admitting: Vascular Surgery

## 2017-12-24 VITALS — BP 165/71 | HR 86 | Resp 16 | Ht 67.5 in | Wt 217.0 lb

## 2017-12-24 DIAGNOSIS — M79605 Pain in left leg: Secondary | ICD-10-CM

## 2017-12-24 DIAGNOSIS — I10 Essential (primary) hypertension: Secondary | ICD-10-CM | POA: Insufficient documentation

## 2017-12-24 DIAGNOSIS — M79604 Pain in right leg: Secondary | ICD-10-CM | POA: Insufficient documentation

## 2017-12-24 DIAGNOSIS — R600 Localized edema: Secondary | ICD-10-CM | POA: Diagnosis not present

## 2017-12-24 DIAGNOSIS — F17210 Nicotine dependence, cigarettes, uncomplicated: Secondary | ICD-10-CM

## 2017-12-24 DIAGNOSIS — L03116 Cellulitis of left lower limb: Secondary | ICD-10-CM | POA: Diagnosis not present

## 2017-12-24 DIAGNOSIS — E785 Hyperlipidemia, unspecified: Secondary | ICD-10-CM

## 2017-12-24 MED ORDER — DOXYCYCLINE HYCLATE 100 MG PO CAPS: 100.0000 mg | ORAL_CAPSULE | Freq: Every day | ORAL | 0 refills | Status: AC
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
## 2018-01-14 ENCOUNTER — Ambulatory Visit
Admission: RE | Admit: 2018-01-14 | Discharge: 2018-01-14 | Disposition: A | Discharge status: Home / Self Care | Payer: Medicare Other | Source: Ambulatory Visit | Attending: Nurse Practitioner | Admitting: Nurse Practitioner

## 2018-01-14 ENCOUNTER — Other Ambulatory Visit: Payer: Self-pay | Admitting: Nurse Practitioner

## 2018-01-14 DIAGNOSIS — E2839 Other primary ovarian failure: Secondary | ICD-10-CM | POA: Diagnosis present

## 2018-01-14 DIAGNOSIS — Z1231 Encounter for screening mammogram for malignant neoplasm of breast: Secondary | ICD-10-CM | POA: Insufficient documentation

## 2018-01-18 ENCOUNTER — Other Ambulatory Visit: Payer: Self-pay | Admitting: Nurse Practitioner

## 2018-01-18 DIAGNOSIS — R928 Other abnormal and inconclusive findings on diagnostic imaging of breast: Secondary | ICD-10-CM

## 2018-01-27 ENCOUNTER — Ambulatory Visit
Admission: RE | Admit: 2018-01-27 | Discharge: 2018-01-27 | Disposition: A | Discharge status: Home / Self Care | Payer: Medicare Other | Source: Ambulatory Visit | Attending: Nurse Practitioner | Admitting: Nurse Practitioner

## 2018-01-27 DIAGNOSIS — R928 Other abnormal and inconclusive findings on diagnostic imaging of breast: Secondary | ICD-10-CM | POA: Diagnosis not present

## 2018-02-01 ENCOUNTER — Ambulatory Visit (INDEPENDENT_AMBULATORY_CARE_PROVIDER_SITE_OTHER): Payer: Medicare Other | Admitting: Vascular Surgery

## 2018-02-01 ENCOUNTER — Encounter (INDEPENDENT_AMBULATORY_CARE_PROVIDER_SITE_OTHER): Payer: Self-pay

## 2018-02-01 ENCOUNTER — Encounter (INDEPENDENT_AMBULATORY_CARE_PROVIDER_SITE_OTHER): Payer: Self-pay | Admitting: Vascular Surgery

## 2018-02-01 ENCOUNTER — Ambulatory Visit (INDEPENDENT_AMBULATORY_CARE_PROVIDER_SITE_OTHER): Payer: Medicare Other

## 2018-02-01 VITALS — BP 193/86 | HR 84 | Resp 19 | Ht 67.5 in | Wt 223.0 lb

## 2018-02-01 DIAGNOSIS — R600 Localized edema: Secondary | ICD-10-CM | POA: Diagnosis not present

## 2018-02-01 DIAGNOSIS — M79604 Pain in right leg: Secondary | ICD-10-CM | POA: Diagnosis not present

## 2018-02-01 DIAGNOSIS — L03116 Cellulitis of left lower limb: Secondary | ICD-10-CM | POA: Diagnosis not present

## 2018-02-01 DIAGNOSIS — E785 Hyperlipidemia, unspecified: Secondary | ICD-10-CM | POA: Diagnosis not present

## 2018-02-01 DIAGNOSIS — F17210 Nicotine dependence, cigarettes, uncomplicated: Secondary | ICD-10-CM

## 2018-02-01 DIAGNOSIS — I70219 Atherosclerosis of native arteries of extremities with intermittent claudication, unspecified extremity: Secondary | ICD-10-CM | POA: Insufficient documentation

## 2018-02-01 DIAGNOSIS — I10 Essential (primary) hypertension: Secondary | ICD-10-CM

## 2018-02-01 DIAGNOSIS — I70223 Atherosclerosis of native arteries of extremities with rest pain, bilateral legs: Secondary | ICD-10-CM | POA: Diagnosis not present

## 2018-02-01 DIAGNOSIS — M79605 Pain in left leg: Secondary | ICD-10-CM

## 2018-02-01 DIAGNOSIS — M159 Polyosteoarthritis, unspecified: Secondary | ICD-10-CM

## 2018-02-01 DIAGNOSIS — M150 Primary generalized (osteo)arthritis: Secondary | ICD-10-CM

## 2018-02-01 DIAGNOSIS — I70229 Atherosclerosis of native arteries of extremities with rest pain, unspecified extremity: Secondary | ICD-10-CM | POA: Insufficient documentation

## 2018-02-01 DIAGNOSIS — M1990 Unspecified osteoarthritis, unspecified site: Secondary | ICD-10-CM | POA: Insufficient documentation
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
## 2018-02-10 ENCOUNTER — Other Ambulatory Visit (INDEPENDENT_AMBULATORY_CARE_PROVIDER_SITE_OTHER): Payer: Self-pay | Admitting: Nurse Practitioner

## 2018-02-12 ENCOUNTER — Encounter: Payer: Self-pay | Admitting: *Deleted

## 2018-02-12 ENCOUNTER — Other Ambulatory Visit: Payer: Self-pay

## 2018-02-12 ENCOUNTER — Ambulatory Visit
Admission: RE | Admit: 2018-02-12 | Discharge: 2018-02-12 | Disposition: A | Discharge status: Home / Self Care | Payer: Medicare Other | Source: Ambulatory Visit | Attending: Vascular Surgery | Admitting: Vascular Surgery

## 2018-02-12 ENCOUNTER — Encounter
Admission: RE | Disposition: A | Discharge status: Home / Self Care | Payer: Self-pay | Source: Ambulatory Visit | Attending: Vascular Surgery

## 2018-02-12 DIAGNOSIS — M1990 Unspecified osteoarthritis, unspecified site: Secondary | ICD-10-CM | POA: Insufficient documentation

## 2018-02-12 DIAGNOSIS — Z880 Allergy status to penicillin: Secondary | ICD-10-CM | POA: Diagnosis not present

## 2018-02-12 DIAGNOSIS — I70222 Atherosclerosis of native arteries of extremities with rest pain, left leg: Secondary | ICD-10-CM | POA: Diagnosis present

## 2018-02-12 DIAGNOSIS — K219 Gastro-esophageal reflux disease without esophagitis: Secondary | ICD-10-CM | POA: Diagnosis not present

## 2018-02-12 DIAGNOSIS — F17210 Nicotine dependence, cigarettes, uncomplicated: Secondary | ICD-10-CM | POA: Diagnosis not present

## 2018-02-12 DIAGNOSIS — Z9889 Other specified postprocedural states: Secondary | ICD-10-CM | POA: Insufficient documentation

## 2018-02-12 DIAGNOSIS — I70201 Unspecified atherosclerosis of native arteries of extremities, right leg: Secondary | ICD-10-CM | POA: Diagnosis not present

## 2018-02-12 DIAGNOSIS — I10 Essential (primary) hypertension: Secondary | ICD-10-CM | POA: Diagnosis not present

## 2018-02-12 DIAGNOSIS — Z9842 Cataract extraction status, left eye: Secondary | ICD-10-CM | POA: Diagnosis not present

## 2018-02-12 DIAGNOSIS — I70229 Atherosclerosis of native arteries of extremities with rest pain, unspecified extremity: Secondary | ICD-10-CM

## 2018-02-12 DIAGNOSIS — E785 Hyperlipidemia, unspecified: Secondary | ICD-10-CM | POA: Diagnosis not present

## 2018-02-12 DIAGNOSIS — Z8249 Family history of ischemic heart disease and other diseases of the circulatory system: Secondary | ICD-10-CM | POA: Diagnosis not present

## 2018-02-12 DIAGNOSIS — I998 Other disorder of circulatory system: Secondary | ICD-10-CM

## 2018-02-12 HISTORY — PX: LOWER EXTREMITY ANGIOGRAPHY: CATH118251

## 2018-02-12 LAB — BUN: BUN: 17 mg/dL (ref 8–23)

## 2018-02-12 LAB — CREATININE, SERUM
Creatinine, Ser: 1.24 mg/dL — ABNORMAL HIGH (ref 0.44–1.00)
GFR calc Af Amer: 49 mL/min — ABNORMAL LOW (ref >60)
GFR calc non Af Amer: 43 mL/min — ABNORMAL LOW (ref >60)

## 2018-02-12 SURGERY — LOWER EXTREMITY ANGIOGRAPHY
Anesthesia: Moderate Sedation | Laterality: Left

## 2018-02-12 MED ORDER — CLINDAMYCIN PHOSPHATE 300 MG/50ML IV SOLN
300.0000 mg | Freq: Once | INTRAVENOUS | Status: AC
  Administered 2018-02-12: 300 mg via INTRAVENOUS

## 2018-02-12 MED ORDER — MIDAZOLAM HCL 2 MG/2ML IJ SOLN
INTRAMUSCULAR | Status: DC | PRN
  Administered 2018-02-12: 2 mg via INTRAVENOUS
  Administered 2018-02-12 (×4): 1 mg via INTRAVENOUS

## 2018-02-12 MED ORDER — IOPAMIDOL (ISOVUE-300) INJECTION 61%
INTRAVENOUS | Status: DC | PRN
  Administered 2018-02-12: 100 mL via INTRA_ARTERIAL

## 2018-02-12 MED ORDER — ACETAMINOPHEN 325 MG PO TABS: 650.0000 mg | ORAL_TABLET | ORAL | Status: DC | PRN

## 2018-02-12 MED ORDER — HYDRALAZINE HCL 20 MG/ML IJ SOLN: 5.0000 mg | INTRAMUSCULAR | Status: DC | PRN

## 2018-02-12 MED ORDER — HEPARIN SODIUM (PORCINE) 1000 UNIT/ML IJ SOLN
INTRAMUSCULAR | Status: DC | PRN
  Administered 2018-02-12: 4000 [IU] via INTRAVENOUS

## 2018-02-12 MED ORDER — CLINDAMYCIN PHOSPHATE 300 MG/50ML IV SOLN
INTRAVENOUS | Status: AC
  Filled 2018-02-12: qty 50

## 2018-02-12 MED ORDER — SODIUM CHLORIDE 0.9% FLUSH: 3.0000 mL | INTRAVENOUS | Status: DC | PRN

## 2018-02-12 MED ORDER — LIDOCAINE HCL (PF) 1 % IJ SOLN
INTRAMUSCULAR | Status: AC
  Filled 2018-02-12: qty 30

## 2018-02-12 MED ORDER — FENTANYL CITRATE (PF) 100 MCG/2ML IJ SOLN
INTRAMUSCULAR | Status: DC | PRN
  Administered 2018-02-12 (×4): 50 ug via INTRAVENOUS

## 2018-02-12 MED ORDER — HYDROCOD POLST-CPM POLST ER 10-8 MG/5ML PO SUER
ORAL | Status: AC
  Filled 2018-02-12: qty 5

## 2018-02-12 MED ORDER — LABETALOL HCL 5 MG/ML IV SOLN: 10.0000 mg | INTRAVENOUS | Status: DC | PRN

## 2018-02-12 MED ORDER — ONDANSETRON HCL 4 MG/2ML IJ SOLN: 4.0000 mg | Freq: Four times a day (QID) | INTRAMUSCULAR | Status: DC | PRN

## 2018-02-12 MED ORDER — HYDROCOD POLST-CPM POLST ER 10-8 MG/5ML PO SUER
5.0000 mL | Freq: Once | ORAL | Status: AC
  Administered 2018-02-12: 5 mL via ORAL

## 2018-02-12 MED ORDER — MORPHINE SULFATE (PF) 4 MG/ML IV SOLN: 2.0000 mg | INTRAVENOUS | Status: DC | PRN

## 2018-02-12 MED ORDER — CLOPIDOGREL BISULFATE 75 MG PO TABS: 75.0000 mg | ORAL_TABLET | Freq: Every day | ORAL | 3 refills | Status: DC

## 2018-02-12 MED ORDER — SODIUM CHLORIDE 0.9 % IV SOLN: INTRAVENOUS | Status: DC

## 2018-02-12 MED ORDER — MIDAZOLAM HCL 2 MG/2ML IJ SOLN
INTRAMUSCULAR | Status: AC
  Filled 2018-02-12: qty 4

## 2018-02-12 MED ORDER — OXYCODONE HCL 5 MG PO TABS: 5.0000 mg | ORAL_TABLET | ORAL | Status: DC | PRN

## 2018-02-12 MED ORDER — FENTANYL CITRATE (PF) 100 MCG/2ML IJ SOLN
INTRAMUSCULAR | Status: AC
  Filled 2018-02-12: qty 2

## 2018-02-12 MED ORDER — MIDAZOLAM HCL 2 MG/2ML IJ SOLN
INTRAMUSCULAR | Status: AC
  Filled 2018-02-12: qty 2

## 2018-02-12 MED ORDER — SODIUM CHLORIDE 0.9 % IV SOLN
INTRAVENOUS | Status: DC
  Administered 2018-02-12: 08:00:00 via INTRAVENOUS

## 2018-02-12 MED ORDER — SODIUM CHLORIDE 0.9% FLUSH: 3.0000 mL | Freq: Two times a day (BID) | INTRAVENOUS | Status: DC

## 2018-02-12 MED ORDER — CLOPIDOGREL BISULFATE 300 MG PO TABS
300.0000 mg | ORAL_TABLET | Freq: Once | ORAL | Status: AC
  Administered 2018-02-12: 300 mg via ORAL

## 2018-02-12 MED ORDER — HEPARIN (PORCINE) IN NACL 1000-0.9 UT/500ML-% IV SOLN
INTRAVENOUS | Status: AC
  Filled 2018-02-12: qty 1000

## 2018-02-12 MED ORDER — CLOPIDOGREL BISULFATE 75 MG PO TABS
ORAL_TABLET | ORAL | Status: AC
  Administered 2018-02-12: 300 mg via ORAL
  Filled 2018-02-12: qty 4

## 2018-02-12 MED ORDER — SODIUM CHLORIDE 0.9 % IV SOLN: 250.0000 mL | INTRAVENOUS | Status: DC | PRN

## 2018-02-12 MED ORDER — HYDROMORPHONE HCL 1 MG/ML IJ SOLN: 1.0000 mg | Freq: Once | INTRAMUSCULAR | Status: DC | PRN

## 2018-02-12 MED ORDER — HEPARIN SODIUM (PORCINE) 1000 UNIT/ML IJ SOLN
INTRAMUSCULAR | Status: AC
  Filled 2018-02-12: qty 1

## 2018-02-12 SURGICAL SUPPLY — 24 items
BALLN LUTONIX DCB 5X100X130 (BALLOONS) ×2
BALLN LUTONIX DCB 6X100X130 (BALLOONS) ×2
BALLN ULTRVRSE 9X40X75C (BALLOONS) ×4
BALLOON LUTONIX DCB 5X100X130 (BALLOONS) IMPLANT
BALLOON LUTONIX DCB 6X100X130 (BALLOONS) IMPLANT
BALLOON ULTRVRSE 9X40X75C (BALLOONS) IMPLANT
CATH PIG 70CM (CATHETERS) ×1 IMPLANT
DEVICE PRESTO INFLATION (MISCELLANEOUS) ×2 IMPLANT
DEVICE STARCLOSE SE CLOSURE (Vascular Products) ×2 IMPLANT
DEVICE TORQUE .025-.038 (MISCELLANEOUS) ×1 IMPLANT
GLIDECATH ANGL TAPER 5F (CATHETERS) ×1 IMPLANT
GLIDEWIRE STIFF .35X180X3 HYDR (WIRE) ×1 IMPLANT
NDL ENTRY 21GA 7CM ECHOTIP (NEEDLE) IMPLANT
NEEDLE ENTRY 21GA 7CM ECHOTIP (NEEDLE) ×4 IMPLANT
PACK ANGIOGRAPHY (CUSTOM PROCEDURE TRAY) ×2 IMPLANT
SET INTRO CAPELLA COAXIAL (SET/KITS/TRAYS/PACK) ×2 IMPLANT
SHEATH BRITE TIP 5FRX11 (SHEATH) ×2 IMPLANT
SHEATH BRITE TIP 7FRX11 (SHEATH) ×2 IMPLANT
STENT LIFESTREAM 8X58X80 (Permanent Stent) ×2 IMPLANT
STENT LIFESTREAM 9X38X80 (Permanent Stent) ×1 IMPLANT
SYR MEDRAD MARK V 150ML (SYRINGE) ×3 IMPLANT
TUBING CONTRAST HIGH PRESS 72 (TUBING) ×3 IMPLANT
WIRE J 3MM .035X145CM (WIRE) ×1 IMPLANT
WIRE MAGIC TOR.035 180C (WIRE) ×2 IMPLANT

## 2018-02-12 NOTE — H&P (Signed)
[facility] History & Physical Update  The patient was interviewed and re-examined.  The patient's previous History and Physical has been reviewed and is unchanged.  There is no change in the plan of care. We plan to proceed with the scheduled procedure.  Hortencia Pilar, MD  02/12/2018, 8:07 AM

## 2018-02-12 NOTE — Op Note (Signed)
[facility]  Percutaneous Study/Intervention Procedural Note   Date of Surgery: 02/12/2018  Surgeon:Akai Dollard, Dolores Lory   Pre-operative Diagnosis: Atherosclerotic occlusive disease bilateral lower extremities with rest pain of the left lower extremity  Post-operative diagnosis:  Same  Procedure(s) Performed:  1.  Abdominal aortogram  2.  Bilateral distal runoff  3.  Percutaneous transluminal angioplasty and stent placement right common iliac artery; "kissing balloon" technique  4.  Percutaneous transluminal and plasty and stent placement left common iliac artery; "kissing balloon" technique  5.  Percutaneous transluminal angioplasty to 6 mm with a Lutonix drug-eluting balloon left external iliac artery  6.  Ultrasound-guided access bilateral common femoral arteries             7.  StarClose closure device bilateral common femoral arteries  Anesthesia: Conscious sedation was administered under my direct supervision by the interventional radiology RN. IV Versed plus fentanyl were utilized. Continuous ECG, pulse oximetry and blood pressure was monitored throughout the entire procedure. Conscious sedation was for a total of 1 hour 30 minutes.  Sheath: 7 French 11 cm Pinnacle sheaths bilateral common femorals retrograde  Contrast: 100 cc  Fluoroscopy Time: 6.7  Indications: Patient presented to the office with increasing pain of her left lower extremity even at rest.  Noninvasive studies as well as physical examination were consistent with profound atherosclerotic occlusive disease.  ABI on the left is recorded 0.34.  Patient is experiencing rest pain symptoms secondary to ischemia of her left lower extremity and is therefore at risk for limb loss.  The risks and benefits for angiography intervention were reviewed all questions were answered patient agrees to proceed.  Procedure:  Holly WYNNS a 72 y.o. female who was identified and appropriate procedural time  out was performed.  The patient was then placed supine on the table and prepped and draped in the usual sterile fashion.  Ultrasound was used to evaluate the right common femoral artery.  It was echolucent and pulsatile indicating it is patent .  An ultrasound image was acquired for the permanent record.  A micropuncture needle was used to access the right common femoral artery under direct ultrasound guidance.  The microwire was then advanced under fluoroscopic guidance without difficulty followed by the micro-sheath  A 0.035 J wire was advanced without resistance and a 5Fr sheath was placed.    The pigtail catheter was then positioned at the level of T12 and an AP image of the aorta was obtained. After review the images the pigtail catheter was repositioned above the aortic bifurcation and bilateral oblique views of the pelvis were obtained. Subsequently the detector was returned to the AP position and bilateral lower extremity runoff was obtained.  After review the images the ultrasound was reprepped and delivered back onto the sterile field. The right common femoral was then imaged with the ultrasound it was noted to be echolucent and pulsatile indicating patency. Images recorded for the permanent record. Under real-time visualization a microneedle was inserted into the anterior wall the common femoral artery microwire was then advanced without difficulty under fluoroscopic guidance followed by placement of the micro-sheath.  A Magic torque wire was then negotiated under fluoroscopic guidance into the aorta. 5 French sheath was then placed.  4000 units of heparin was given and allowed to circulate for proximally 4 minutes.  The right sheath was then upsized to a 7 [place] sheath as well after a Magic torque wire was advanced through the pigtail catheter. Magnified images of  the aortic bifurcation were then made using hand injection contrast from the femoral sheaths. After appropriate sizing a 8 x 58  lifestream stent was selected for the right and a 8 x 58 lifestream stent was selected for the left. There were then advanced and positioned just above the aortic bifurcation. Insufflation for full expansion of the stents was performed simultaneously. Follow-up imaging was then performed and the left side required extension and both common iliac origin seems somewhat undersized.  A 9 x 37 lifestream stent was then deployed on the left extending into the distal external iliac artery just above the iliac bifurcation.  9 mm x 40 mm Ultraverse balloons were then advanced up both right and left side simultaneously and inflation to 8 atm was performed together.  2 separate inflations on each side were made.  The pigtail catheter was then introduced up the right and bolus injection of contrast was used to perform final imaging of the distal aortic reconstruction.  Common iliac reconstruction was excellent with less than 5% residual stenosis.  I then turned my attention to the 70% stenosis of the external iliac at its origin and diffuse disease throughout its entire length.  Initially a 5 mm x 100 mm Lutonix balloon was inflated across the external iliac down to the ileal inguinal ligament.  Inflation was to 10 atm for 1 minute.  This appeared somewhat undersized on follow-up imaging a 6 mm x 100 mm Lutonix drug-eluting balloon was advanced across the left external iliac and inflated to 10 atm for 1 minute.  Follow-up imaging now demonstrated less than 5% residual stenosis with full expansion of the external iliac throughout its course.  Oblique views were then obtained of the groins in succession and Star close device is deployed without difficulty. There were no immediate complications   Findings:   Aortogram:  The abdominal aorta is opacified with a bolus injection contrast.  Injection demonstrates diffuse disease but there are no hemodynamically significant lesions noted until the distal aortic bifurcation  where bilateral greater than 70% ostial iliac lesions are identified on the left this is associated with occlusion approximately 10 to 15 mm distal to the origin.  The occlusion is approximately 20 mm in length there is moderate poststenotic dilatation noted of the distal left common iliac artery as well.  The right external iliac artery is patent without hemodynamically significant stenosis.  The left external iliac artery demonstrates a 70% stenosis at its origin and diffuse disease throughout its course measuring only 3 mm in diameter down to the ileal inguinal ligament  Right Lower Extremity: The proximal two thirds of the common femoral are patent however there is bulky calcific plaque at the origins of both the profunda femoris and the superficial femoral which appears to correlate with a greater than 80% stenosis of both of these arteries.  Left Lower Extremity: The common femoral demonstrates a subtotal occlusion with a coral reef-like plaque in its midportion.  The profunda femoris is patent and its visualized segments.  It collateralizes well distally.  The SFA is diffusely diseased in its proximal one third.  It occludes in its midportion and remains occluded with reconstitution via profunda collaterals of the above-knee popliteal.  Trifurcation is patent with posterior tibial being the dominant runoff to the foot but the anterior tibial also flowing to the foot filling the pedal arch as well.  Peroneal is small but patent.  Following placement of the iliac stents there is now wide patency with less than  5 % residual stenosis with rapid flow through the aortic bifurcation bilaterally.  Following angioplasty of the external iliac on the left there is less than 5% residual stenosis.  Summary:  Successful reconstruction of the distal aorta and bilateral iliac arteries  Disposition: Patient was taken to the recovery room in stable condition having tolerated the procedure well.  Belenda Cruise  Mairim Bade 02/12/2018,10:11 AM

## 2018-02-15 ENCOUNTER — Encounter: Payer: Self-pay | Admitting: Vascular Surgery

## 2018-03-03 ENCOUNTER — Other Ambulatory Visit (INDEPENDENT_AMBULATORY_CARE_PROVIDER_SITE_OTHER): Payer: Self-pay | Admitting: Vascular Surgery

## 2018-03-03 DIAGNOSIS — Z95820 Peripheral vascular angioplasty status with implants and grafts: Secondary | ICD-10-CM

## 2018-03-04 ENCOUNTER — Ambulatory Visit (INDEPENDENT_AMBULATORY_CARE_PROVIDER_SITE_OTHER): Payer: Medicare Other | Admitting: Vascular Surgery

## 2018-03-04 ENCOUNTER — Ambulatory Visit (INDEPENDENT_AMBULATORY_CARE_PROVIDER_SITE_OTHER): Payer: Medicare Other

## 2018-03-04 ENCOUNTER — Encounter (INDEPENDENT_AMBULATORY_CARE_PROVIDER_SITE_OTHER): Payer: Self-pay | Admitting: Vascular Surgery

## 2018-03-04 VITALS — BP 153/73 | HR 86 | Resp 16 | Ht 67.0 in | Wt 219.6 lb

## 2018-03-04 DIAGNOSIS — E785 Hyperlipidemia, unspecified: Secondary | ICD-10-CM | POA: Diagnosis not present

## 2018-03-04 DIAGNOSIS — M150 Primary generalized (osteo)arthritis: Secondary | ICD-10-CM | POA: Diagnosis not present

## 2018-03-04 DIAGNOSIS — Z95820 Peripheral vascular angioplasty status with implants and grafts: Secondary | ICD-10-CM

## 2018-03-04 DIAGNOSIS — I10 Essential (primary) hypertension: Secondary | ICD-10-CM

## 2018-03-04 DIAGNOSIS — M159 Polyosteoarthritis, unspecified: Secondary | ICD-10-CM

## 2018-03-04 DIAGNOSIS — I70223 Atherosclerosis of native arteries of extremities with rest pain, bilateral legs: Secondary | ICD-10-CM
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
## 2018-05-05 ENCOUNTER — Other Ambulatory Visit (INDEPENDENT_AMBULATORY_CARE_PROVIDER_SITE_OTHER): Payer: Self-pay | Admitting: Vascular Surgery

## 2018-06-07 ENCOUNTER — Ambulatory Visit (INDEPENDENT_AMBULATORY_CARE_PROVIDER_SITE_OTHER): Payer: Medicare Other | Admitting: Vascular Surgery

## 2018-06-07 ENCOUNTER — Encounter (INDEPENDENT_AMBULATORY_CARE_PROVIDER_SITE_OTHER): Payer: Self-pay | Admitting: Vascular Surgery

## 2018-06-07 ENCOUNTER — Ambulatory Visit (INDEPENDENT_AMBULATORY_CARE_PROVIDER_SITE_OTHER): Payer: Medicare Other

## 2018-06-07 ENCOUNTER — Other Ambulatory Visit: Payer: Self-pay

## 2018-06-07 VITALS — BP 180/74 | HR 77 | Resp 16 | Ht 67.5 in | Wt 224.6 lb

## 2018-06-07 DIAGNOSIS — I70223 Atherosclerosis of native arteries of extremities with rest pain, bilateral legs: Secondary | ICD-10-CM

## 2018-06-07 DIAGNOSIS — Z95820 Peripheral vascular angioplasty status with implants and grafts: Secondary | ICD-10-CM

## 2018-06-07 DIAGNOSIS — Z79899 Other long term (current) drug therapy: Secondary | ICD-10-CM

## 2018-06-07 DIAGNOSIS — I10 Essential (primary) hypertension: Secondary | ICD-10-CM | POA: Diagnosis not present

## 2018-06-07 DIAGNOSIS — Z791 Long term (current) use of non-steroidal anti-inflammatories (NSAID): Secondary | ICD-10-CM

## 2018-06-07 DIAGNOSIS — M150 Primary generalized (osteo)arthritis: Secondary | ICD-10-CM

## 2018-06-07 DIAGNOSIS — E785 Hyperlipidemia, unspecified: Secondary | ICD-10-CM

## 2018-06-07 DIAGNOSIS — M159 Polyosteoarthritis, unspecified: Secondary | ICD-10-CM

## 2018-06-07 DIAGNOSIS — F17210 Nicotine dependence, cigarettes, uncomplicated: Secondary | ICD-10-CM
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
## 2018-10-05 DIAGNOSIS — Z6835 Body mass index (BMI) 35.0-35.9, adult: Secondary | ICD-10-CM | POA: Diagnosis not present

## 2018-10-05 DIAGNOSIS — Z1239 Encounter for other screening for malignant neoplasm of breast: Secondary | ICD-10-CM | POA: Diagnosis not present

## 2018-10-05 DIAGNOSIS — N611 Abscess of the breast and nipple: Secondary | ICD-10-CM | POA: Diagnosis not present

## 2018-10-06 ENCOUNTER — Other Ambulatory Visit: Payer: Self-pay | Admitting: Nurse Practitioner

## 2018-10-06 DIAGNOSIS — Z1231 Encounter for screening mammogram for malignant neoplasm of breast: Secondary | ICD-10-CM

## 2018-10-31 ENCOUNTER — Other Ambulatory Visit (INDEPENDENT_AMBULATORY_CARE_PROVIDER_SITE_OTHER): Payer: Self-pay | Admitting: Vascular Surgery

## 2018-12-08 ENCOUNTER — Other Ambulatory Visit: Payer: Self-pay

## 2018-12-08 ENCOUNTER — Encounter (INDEPENDENT_AMBULATORY_CARE_PROVIDER_SITE_OTHER): Payer: Self-pay | Admitting: Nurse Practitioner

## 2018-12-08 ENCOUNTER — Ambulatory Visit (INDEPENDENT_AMBULATORY_CARE_PROVIDER_SITE_OTHER): Payer: Medicare HMO | Admitting: Nurse Practitioner

## 2018-12-08 ENCOUNTER — Encounter (INDEPENDENT_AMBULATORY_CARE_PROVIDER_SITE_OTHER): Payer: Self-pay

## 2018-12-08 ENCOUNTER — Ambulatory Visit (INDEPENDENT_AMBULATORY_CARE_PROVIDER_SITE_OTHER): Payer: Medicare HMO

## 2018-12-08 VITALS — BP 174/72 | HR 78 | Resp 16 | Wt 224.0 lb

## 2018-12-08 DIAGNOSIS — I10 Essential (primary) hypertension: Secondary | ICD-10-CM | POA: Diagnosis not present

## 2018-12-08 DIAGNOSIS — R600 Localized edema: Secondary | ICD-10-CM | POA: Diagnosis not present

## 2018-12-08 DIAGNOSIS — I70223 Atherosclerosis of native arteries of extremities with rest pain, bilateral legs: Secondary | ICD-10-CM

## 2018-12-08 DIAGNOSIS — E785 Hyperlipidemia, unspecified: Secondary | ICD-10-CM | POA: Diagnosis not present

## 2018-12-15 ENCOUNTER — Encounter (INDEPENDENT_AMBULATORY_CARE_PROVIDER_SITE_OTHER): Payer: Self-pay | Admitting: Nurse Practitioner
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
## 2019-01-04 DIAGNOSIS — Z23 Encounter for immunization: Secondary | ICD-10-CM | POA: Diagnosis not present

## 2019-03-15 DIAGNOSIS — I10 Essential (primary) hypertension: Secondary | ICD-10-CM | POA: Diagnosis not present

## 2019-03-15 DIAGNOSIS — J449 Chronic obstructive pulmonary disease, unspecified: Secondary | ICD-10-CM | POA: Diagnosis not present

## 2019-03-15 DIAGNOSIS — M5440 Lumbago with sciatica, unspecified side: Secondary | ICD-10-CM | POA: Diagnosis not present

## 2019-03-15 DIAGNOSIS — Z1331 Encounter for screening for depression: Secondary | ICD-10-CM | POA: Diagnosis not present

## 2019-03-15 DIAGNOSIS — R739 Hyperglycemia, unspecified: Secondary | ICD-10-CM | POA: Diagnosis not present

## 2019-03-15 DIAGNOSIS — Z6836 Body mass index (BMI) 36.0-36.9, adult: Secondary | ICD-10-CM | POA: Diagnosis not present

## 2019-03-15 DIAGNOSIS — Z87891 Personal history of nicotine dependence: Secondary | ICD-10-CM | POA: Diagnosis not present

## 2019-03-15 DIAGNOSIS — Z9181 History of falling: Secondary | ICD-10-CM | POA: Diagnosis not present

## 2019-03-15 DIAGNOSIS — E785 Hyperlipidemia, unspecified: Secondary | ICD-10-CM | POA: Diagnosis not present

## 2019-04-28 ENCOUNTER — Other Ambulatory Visit (INDEPENDENT_AMBULATORY_CARE_PROVIDER_SITE_OTHER): Payer: Self-pay | Admitting: Vascular Surgery

## 2019-06-09 ENCOUNTER — Ambulatory Visit (INDEPENDENT_AMBULATORY_CARE_PROVIDER_SITE_OTHER): Payer: Medicare HMO

## 2019-06-09 ENCOUNTER — Other Ambulatory Visit: Payer: Self-pay

## 2019-06-09 ENCOUNTER — Ambulatory Visit (INDEPENDENT_AMBULATORY_CARE_PROVIDER_SITE_OTHER): Payer: Medicare HMO | Admitting: Vascular Surgery

## 2019-06-09 ENCOUNTER — Encounter (INDEPENDENT_AMBULATORY_CARE_PROVIDER_SITE_OTHER): Payer: Self-pay | Admitting: Vascular Surgery

## 2019-06-09 VITALS — BP 176/75 | HR 82 | Resp 16 | Ht 69.0 in | Wt 227.0 lb

## 2019-06-09 DIAGNOSIS — R600 Localized edema: Secondary | ICD-10-CM | POA: Diagnosis not present

## 2019-06-09 DIAGNOSIS — I10 Essential (primary) hypertension: Secondary | ICD-10-CM | POA: Diagnosis not present

## 2019-06-09 DIAGNOSIS — M8949 Other hypertrophic osteoarthropathy, multiple sites: Secondary | ICD-10-CM

## 2019-06-09 DIAGNOSIS — E785 Hyperlipidemia, unspecified: Secondary | ICD-10-CM

## 2019-06-09 DIAGNOSIS — I70223 Atherosclerosis of native arteries of extremities with rest pain, bilateral legs: Secondary | ICD-10-CM

## 2019-06-09 DIAGNOSIS — I70213 Atherosclerosis of native arteries of extremities with intermittent claudication, bilateral legs: Secondary | ICD-10-CM | POA: Diagnosis not present

## 2019-06-09 DIAGNOSIS — M159 Polyosteoarthritis, unspecified: Secondary | ICD-10-CM
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
## 2019-06-10 ENCOUNTER — Encounter (INDEPENDENT_AMBULATORY_CARE_PROVIDER_SITE_OTHER): Payer: Self-pay | Admitting: Vascular Surgery

## 2019-06-14 DIAGNOSIS — Z6835 Body mass index (BMI) 35.0-35.9, adult: Secondary | ICD-10-CM | POA: Diagnosis not present

## 2019-06-14 DIAGNOSIS — E119 Type 2 diabetes mellitus without complications: Secondary | ICD-10-CM | POA: Diagnosis not present

## 2019-06-14 DIAGNOSIS — J449 Chronic obstructive pulmonary disease, unspecified: Secondary | ICD-10-CM | POA: Diagnosis not present

## 2019-06-14 DIAGNOSIS — M1990 Unspecified osteoarthritis, unspecified site: Secondary | ICD-10-CM | POA: Diagnosis not present

## 2019-06-14 DIAGNOSIS — I70213 Atherosclerosis of native arteries of extremities with intermittent claudication, bilateral legs: Secondary | ICD-10-CM | POA: Diagnosis not present

## 2019-06-14 DIAGNOSIS — E785 Hyperlipidemia, unspecified: Secondary | ICD-10-CM | POA: Diagnosis not present

## 2019-06-14 DIAGNOSIS — Z87891 Personal history of nicotine dependence: Secondary | ICD-10-CM | POA: Diagnosis not present

## 2019-06-14 DIAGNOSIS — M5440 Lumbago with sciatica, unspecified side: Secondary | ICD-10-CM | POA: Diagnosis not present

## 2019-06-14 DIAGNOSIS — I10 Essential (primary) hypertension: Secondary | ICD-10-CM | POA: Diagnosis not present

## 2019-09-15 DIAGNOSIS — E785 Hyperlipidemia, unspecified: Secondary | ICD-10-CM | POA: Diagnosis not present

## 2019-09-15 DIAGNOSIS — Z6834 Body mass index (BMI) 34.0-34.9, adult: Secondary | ICD-10-CM | POA: Diagnosis not present

## 2019-09-15 DIAGNOSIS — I70213 Atherosclerosis of native arteries of extremities with intermittent claudication, bilateral legs: Secondary | ICD-10-CM | POA: Diagnosis not present

## 2019-09-15 DIAGNOSIS — M1990 Unspecified osteoarthritis, unspecified site: Secondary | ICD-10-CM | POA: Diagnosis not present

## 2019-09-15 DIAGNOSIS — J302 Other seasonal allergic rhinitis: Secondary | ICD-10-CM | POA: Diagnosis not present

## 2019-09-15 DIAGNOSIS — J449 Chronic obstructive pulmonary disease, unspecified: Secondary | ICD-10-CM | POA: Diagnosis not present

## 2019-09-15 DIAGNOSIS — I10 Essential (primary) hypertension: Secondary | ICD-10-CM | POA: Diagnosis not present

## 2019-09-15 DIAGNOSIS — R739 Hyperglycemia, unspecified: Secondary | ICD-10-CM | POA: Diagnosis not present

## 2019-09-15 DIAGNOSIS — M5440 Lumbago with sciatica, unspecified side: Secondary | ICD-10-CM | POA: Diagnosis not present

## 2019-09-30 DIAGNOSIS — R05 Cough: Secondary | ICD-10-CM | POA: Diagnosis not present

## 2019-09-30 DIAGNOSIS — J019 Acute sinusitis, unspecified: Secondary | ICD-10-CM | POA: Diagnosis not present

## 2019-09-30 DIAGNOSIS — B9689 Other specified bacterial agents as the cause of diseases classified elsewhere: Secondary | ICD-10-CM | POA: Diagnosis not present

## 2019-09-30 DIAGNOSIS — F17211 Nicotine dependence, cigarettes, in remission: Secondary | ICD-10-CM | POA: Diagnosis not present

## 2019-10-17 DIAGNOSIS — J440 Chronic obstructive pulmonary disease with acute lower respiratory infection: Secondary | ICD-10-CM | POA: Diagnosis not present

## 2019-10-17 DIAGNOSIS — C799 Secondary malignant neoplasm of unspecified site: Secondary | ICD-10-CM | POA: Diagnosis not present

## 2019-10-17 DIAGNOSIS — E1165 Type 2 diabetes mellitus with hyperglycemia: Secondary | ICD-10-CM | POA: Diagnosis not present

## 2019-10-17 DIAGNOSIS — N179 Acute kidney failure, unspecified: Secondary | ICD-10-CM | POA: Diagnosis not present

## 2019-10-17 DIAGNOSIS — C7889 Secondary malignant neoplasm of other digestive organs: Secondary | ICD-10-CM | POA: Diagnosis not present

## 2019-10-17 DIAGNOSIS — I10 Essential (primary) hypertension: Secondary | ICD-10-CM | POA: Diagnosis not present

## 2019-10-17 DIAGNOSIS — J189 Pneumonia, unspecified organism: Secondary | ICD-10-CM | POA: Diagnosis not present

## 2019-10-17 DIAGNOSIS — Z20822 Contact with and (suspected) exposure to covid-19: Secondary | ICD-10-CM | POA: Diagnosis not present

## 2019-10-17 DIAGNOSIS — C7801 Secondary malignant neoplasm of right lung: Secondary | ICD-10-CM | POA: Diagnosis not present

## 2019-10-17 DIAGNOSIS — D491 Neoplasm of unspecified behavior of respiratory system: Secondary | ICD-10-CM | POA: Diagnosis not present

## 2019-10-17 DIAGNOSIS — R0902 Hypoxemia: Secondary | ICD-10-CM | POA: Diagnosis not present

## 2019-10-17 DIAGNOSIS — R2689 Other abnormalities of gait and mobility: Secondary | ICD-10-CM | POA: Diagnosis not present

## 2019-10-17 DIAGNOSIS — K7689 Other specified diseases of liver: Secondary | ICD-10-CM | POA: Diagnosis not present

## 2019-10-17 DIAGNOSIS — C801 Malignant (primary) neoplasm, unspecified: Secondary | ICD-10-CM | POA: Diagnosis not present

## 2019-10-17 DIAGNOSIS — E1151 Type 2 diabetes mellitus with diabetic peripheral angiopathy without gangrene: Secondary | ICD-10-CM | POA: Diagnosis not present

## 2019-10-17 DIAGNOSIS — I6523 Occlusion and stenosis of bilateral carotid arteries: Secondary | ICD-10-CM | POA: Diagnosis not present

## 2019-10-17 DIAGNOSIS — R937 Abnormal findings on diagnostic imaging of other parts of musculoskeletal system: Secondary | ICD-10-CM | POA: Diagnosis not present

## 2019-10-17 DIAGNOSIS — E119 Type 2 diabetes mellitus without complications: Secondary | ICD-10-CM | POA: Diagnosis not present

## 2019-10-17 DIAGNOSIS — C7951 Secondary malignant neoplasm of bone: Secondary | ICD-10-CM | POA: Diagnosis not present

## 2019-10-17 DIAGNOSIS — R0609 Other forms of dyspnea: Secondary | ICD-10-CM | POA: Diagnosis not present

## 2019-10-17 DIAGNOSIS — I739 Peripheral vascular disease, unspecified: Secondary | ICD-10-CM | POA: Diagnosis not present

## 2019-10-17 DIAGNOSIS — J449 Chronic obstructive pulmonary disease, unspecified: Secondary | ICD-10-CM | POA: Diagnosis not present

## 2019-10-17 DIAGNOSIS — Z515 Encounter for palliative care: Secondary | ICD-10-CM | POA: Diagnosis not present

## 2019-10-17 DIAGNOSIS — R162 Hepatomegaly with splenomegaly, not elsewhere classified: Secondary | ICD-10-CM | POA: Diagnosis not present

## 2019-10-17 DIAGNOSIS — E739 Lactose intolerance, unspecified: Secondary | ICD-10-CM | POA: Diagnosis not present

## 2019-10-17 DIAGNOSIS — Z66 Do not resuscitate: Secondary | ICD-10-CM | POA: Diagnosis not present

## 2019-10-17 DIAGNOSIS — E785 Hyperlipidemia, unspecified: Secondary | ICD-10-CM | POA: Diagnosis not present

## 2019-10-17 DIAGNOSIS — J441 Chronic obstructive pulmonary disease with (acute) exacerbation: Secondary | ICD-10-CM | POA: Diagnosis not present

## 2019-10-17 DIAGNOSIS — J9601 Acute respiratory failure with hypoxia: Secondary | ICD-10-CM | POA: Diagnosis not present

## 2019-10-17 DIAGNOSIS — C787 Secondary malignant neoplasm of liver and intrahepatic bile duct: Secondary | ICD-10-CM | POA: Diagnosis not present

## 2019-10-17 DIAGNOSIS — R739 Hyperglycemia, unspecified: Secondary | ICD-10-CM | POA: Diagnosis not present

## 2019-10-17 DIAGNOSIS — Z794 Long term (current) use of insulin: Secondary | ICD-10-CM | POA: Diagnosis not present

## 2019-10-17 DIAGNOSIS — C3490 Malignant neoplasm of unspecified part of unspecified bronchus or lung: Secondary | ICD-10-CM | POA: Diagnosis not present

## 2019-10-17 DIAGNOSIS — I5189 Other ill-defined heart diseases: Secondary | ICD-10-CM | POA: Diagnosis not present

## 2019-10-17 DIAGNOSIS — Z743 Need for continuous supervision: Secondary | ICD-10-CM | POA: Diagnosis not present

## 2019-10-17 DIAGNOSIS — J90 Pleural effusion, not elsewhere classified: Secondary | ICD-10-CM | POA: Diagnosis not present

## 2019-10-17 DIAGNOSIS — I6782 Cerebral ischemia: Secondary | ICD-10-CM | POA: Diagnosis not present

## 2019-10-17 DIAGNOSIS — R0789 Other chest pain: Secondary | ICD-10-CM | POA: Diagnosis not present

## 2019-10-17 DIAGNOSIS — E1122 Type 2 diabetes mellitus with diabetic chronic kidney disease: Secondary | ICD-10-CM | POA: Diagnosis not present

## 2019-10-17 DIAGNOSIS — I639 Cerebral infarction, unspecified: Secondary | ICD-10-CM | POA: Diagnosis not present

## 2019-10-17 DIAGNOSIS — R918 Other nonspecific abnormal finding of lung field: Secondary | ICD-10-CM | POA: Diagnosis not present

## 2019-10-17 DIAGNOSIS — D381 Neoplasm of uncertain behavior of trachea, bronchus and lung: Secondary | ICD-10-CM | POA: Diagnosis not present

## 2019-10-17 DIAGNOSIS — J181 Lobar pneumonia, unspecified organism: Secondary | ICD-10-CM | POA: Diagnosis not present

## 2019-10-17 DIAGNOSIS — C7971 Secondary malignant neoplasm of right adrenal gland: Secondary | ICD-10-CM | POA: Diagnosis not present

## 2019-10-17 DIAGNOSIS — R0602 Shortness of breath: Secondary | ICD-10-CM | POA: Diagnosis not present

## 2019-10-17 DIAGNOSIS — I129 Hypertensive chronic kidney disease with stage 1 through stage 4 chronic kidney disease, or unspecified chronic kidney disease: Secondary | ICD-10-CM | POA: Diagnosis not present

## 2019-10-17 DIAGNOSIS — N183 Chronic kidney disease, stage 3 unspecified: Secondary | ICD-10-CM | POA: Diagnosis not present

## 2019-10-17 DIAGNOSIS — D7389 Other diseases of spleen: Secondary | ICD-10-CM | POA: Diagnosis not present

## 2019-10-17 DIAGNOSIS — G319 Degenerative disease of nervous system, unspecified: Secondary | ICD-10-CM | POA: Diagnosis not present

## 2019-10-17 DIAGNOSIS — M545 Low back pain: Secondary | ICD-10-CM | POA: Diagnosis not present

## 2019-10-17 DIAGNOSIS — E1140 Type 2 diabetes mellitus with diabetic neuropathy, unspecified: Secondary | ICD-10-CM | POA: Diagnosis not present

## 2019-10-17 DIAGNOSIS — G8929 Other chronic pain: Secondary | ICD-10-CM | POA: Diagnosis not present

## 2019-10-17 DIAGNOSIS — F17200 Nicotine dependence, unspecified, uncomplicated: Secondary | ICD-10-CM | POA: Diagnosis not present

## 2019-10-17 DIAGNOSIS — Z9981 Dependence on supplemental oxygen: Secondary | ICD-10-CM | POA: Diagnosis not present

## 2019-10-17 DIAGNOSIS — J9691 Respiratory failure, unspecified with hypoxia: Secondary | ICD-10-CM | POA: Diagnosis not present

## 2019-10-17 DIAGNOSIS — F4322 Adjustment disorder with anxiety: Secondary | ICD-10-CM | POA: Diagnosis not present

## 2019-10-17 DIAGNOSIS — R9089 Other abnormal findings on diagnostic imaging of central nervous system: Secondary | ICD-10-CM | POA: Diagnosis not present

## 2019-10-25 ENCOUNTER — Other Ambulatory Visit (INDEPENDENT_AMBULATORY_CARE_PROVIDER_SITE_OTHER): Payer: Self-pay | Admitting: Vascular Surgery

## 2019-11-04 DIAGNOSIS — Z9981 Dependence on supplemental oxygen: Secondary | ICD-10-CM | POA: Diagnosis not present

## 2019-11-04 DIAGNOSIS — D649 Anemia, unspecified: Secondary | ICD-10-CM | POA: Diagnosis not present

## 2019-11-04 DIAGNOSIS — C787 Secondary malignant neoplasm of liver and intrahepatic bile duct: Secondary | ICD-10-CM | POA: Diagnosis not present

## 2019-11-04 DIAGNOSIS — R918 Other nonspecific abnormal finding of lung field: Secondary | ICD-10-CM | POA: Diagnosis not present

## 2019-11-04 DIAGNOSIS — E1140 Type 2 diabetes mellitus with diabetic neuropathy, unspecified: Secondary | ICD-10-CM | POA: Diagnosis not present

## 2019-11-04 DIAGNOSIS — M1990 Unspecified osteoarthritis, unspecified site: Secondary | ICD-10-CM | POA: Diagnosis present

## 2019-11-04 DIAGNOSIS — I509 Heart failure, unspecified: Secondary | ICD-10-CM | POA: Diagnosis not present

## 2019-11-04 DIAGNOSIS — J440 Chronic obstructive pulmonary disease with acute lower respiratory infection: Secondary | ICD-10-CM | POA: Diagnosis not present

## 2019-11-04 DIAGNOSIS — J441 Chronic obstructive pulmonary disease with (acute) exacerbation: Secondary | ICD-10-CM | POA: Diagnosis not present

## 2019-11-04 DIAGNOSIS — C7971 Secondary malignant neoplasm of right adrenal gland: Secondary | ICD-10-CM | POA: Diagnosis present

## 2019-11-04 DIAGNOSIS — I110 Hypertensive heart disease with heart failure: Secondary | ICD-10-CM | POA: Diagnosis present

## 2019-11-04 DIAGNOSIS — A419 Sepsis, unspecified organism: Secondary | ICD-10-CM | POA: Diagnosis not present

## 2019-11-04 DIAGNOSIS — C7889 Secondary malignant neoplasm of other digestive organs: Secondary | ICD-10-CM | POA: Diagnosis present

## 2019-11-04 DIAGNOSIS — R5081 Fever presenting with conditions classified elsewhere: Secondary | ICD-10-CM | POA: Diagnosis not present

## 2019-11-04 DIAGNOSIS — C771 Secondary and unspecified malignant neoplasm of intrathoracic lymph nodes: Secondary | ICD-10-CM | POA: Diagnosis not present

## 2019-11-04 DIAGNOSIS — J189 Pneumonia, unspecified organism: Secondary | ICD-10-CM | POA: Diagnosis not present

## 2019-11-04 DIAGNOSIS — J9811 Atelectasis: Secondary | ICD-10-CM | POA: Diagnosis not present

## 2019-11-04 DIAGNOSIS — C3490 Malignant neoplasm of unspecified part of unspecified bronchus or lung: Secondary | ICD-10-CM | POA: Diagnosis not present

## 2019-11-04 DIAGNOSIS — J9621 Acute and chronic respiratory failure with hypoxia: Secondary | ICD-10-CM | POA: Diagnosis not present

## 2019-11-04 DIAGNOSIS — J80 Acute respiratory distress syndrome: Secondary | ICD-10-CM | POA: Diagnosis not present

## 2019-11-04 DIAGNOSIS — Z66 Do not resuscitate: Secondary | ICD-10-CM | POA: Diagnosis not present

## 2019-11-04 DIAGNOSIS — J910 Malignant pleural effusion: Secondary | ICD-10-CM | POA: Diagnosis present

## 2019-11-04 DIAGNOSIS — Y95 Nosocomial condition: Secondary | ICD-10-CM | POA: Diagnosis present

## 2019-11-04 DIAGNOSIS — R4182 Altered mental status, unspecified: Secondary | ICD-10-CM | POA: Diagnosis not present

## 2019-11-04 DIAGNOSIS — R069 Unspecified abnormalities of breathing: Secondary | ICD-10-CM | POA: Diagnosis not present

## 2019-11-04 DIAGNOSIS — R2689 Other abnormalities of gait and mobility: Secondary | ICD-10-CM | POA: Diagnosis not present

## 2019-11-04 DIAGNOSIS — R030 Elevated blood-pressure reading, without diagnosis of hypertension: Secondary | ICD-10-CM | POA: Diagnosis not present

## 2019-11-04 DIAGNOSIS — E1151 Type 2 diabetes mellitus with diabetic peripheral angiopathy without gangrene: Secondary | ICD-10-CM | POA: Diagnosis not present

## 2019-11-04 DIAGNOSIS — C3432 Malignant neoplasm of lower lobe, left bronchus or lung: Secondary | ICD-10-CM | POA: Diagnosis present

## 2019-11-04 DIAGNOSIS — E039 Hypothyroidism, unspecified: Secondary | ICD-10-CM | POA: Diagnosis not present

## 2019-11-04 DIAGNOSIS — I248 Other forms of acute ischemic heart disease: Secondary | ICD-10-CM | POA: Diagnosis present

## 2019-11-04 DIAGNOSIS — H9190 Unspecified hearing loss, unspecified ear: Secondary | ICD-10-CM | POA: Diagnosis present

## 2019-11-04 DIAGNOSIS — Z7189 Other specified counseling: Secondary | ICD-10-CM | POA: Diagnosis not present

## 2019-11-04 DIAGNOSIS — Z20822 Contact with and (suspected) exposure to covid-19: Secondary | ICD-10-CM | POA: Diagnosis not present

## 2019-11-04 DIAGNOSIS — E118 Type 2 diabetes mellitus with unspecified complications: Secondary | ICD-10-CM | POA: Diagnosis not present

## 2019-11-04 DIAGNOSIS — K219 Gastro-esophageal reflux disease without esophagitis: Secondary | ICD-10-CM | POA: Diagnosis present

## 2019-11-04 DIAGNOSIS — J918 Pleural effusion in other conditions classified elsewhere: Secondary | ICD-10-CM | POA: Diagnosis not present

## 2019-11-04 DIAGNOSIS — R0602 Shortness of breath: Secondary | ICD-10-CM | POA: Diagnosis not present

## 2019-11-04 DIAGNOSIS — R0902 Hypoxemia: Secondary | ICD-10-CM | POA: Diagnosis not present

## 2019-11-04 DIAGNOSIS — E785 Hyperlipidemia, unspecified: Secondary | ICD-10-CM | POA: Diagnosis not present

## 2019-11-04 DIAGNOSIS — J90 Pleural effusion, not elsewhere classified: Secondary | ICD-10-CM | POA: Diagnosis not present

## 2019-11-04 DIAGNOSIS — C7951 Secondary malignant neoplasm of bone: Secondary | ICD-10-CM | POA: Diagnosis present

## 2019-11-04 DIAGNOSIS — E119 Type 2 diabetes mellitus without complications: Secondary | ICD-10-CM | POA: Diagnosis not present

## 2019-11-04 DIAGNOSIS — Z743 Need for continuous supervision: Secondary | ICD-10-CM | POA: Diagnosis not present

## 2019-11-04 DIAGNOSIS — I5033 Acute on chronic diastolic (congestive) heart failure: Secondary | ICD-10-CM | POA: Diagnosis not present

## 2019-11-04 DIAGNOSIS — R531 Weakness: Secondary | ICD-10-CM | POA: Diagnosis not present

## 2019-11-04 DIAGNOSIS — F17200 Nicotine dependence, unspecified, uncomplicated: Secondary | ICD-10-CM | POA: Diagnosis not present

## 2019-11-04 DIAGNOSIS — F17210 Nicotine dependence, cigarettes, uncomplicated: Secondary | ICD-10-CM | POA: Diagnosis present

## 2019-11-04 DIAGNOSIS — Z515 Encounter for palliative care: Secondary | ICD-10-CM | POA: Diagnosis not present

## 2019-11-04 DIAGNOSIS — E1165 Type 2 diabetes mellitus with hyperglycemia: Secondary | ICD-10-CM | POA: Diagnosis not present

## 2019-11-04 DIAGNOSIS — I10 Essential (primary) hypertension: Secondary | ICD-10-CM | POA: Diagnosis not present

## 2019-11-04 DIAGNOSIS — C799 Secondary malignant neoplasm of unspecified site: Secondary | ICD-10-CM | POA: Diagnosis not present

## 2019-11-04 DIAGNOSIS — I779 Disorder of arteries and arterioles, unspecified: Secondary | ICD-10-CM | POA: Diagnosis not present

## 2019-11-07 DIAGNOSIS — C799 Secondary malignant neoplasm of unspecified site: Secondary | ICD-10-CM | POA: Diagnosis not present

## 2019-11-07 DIAGNOSIS — J441 Chronic obstructive pulmonary disease with (acute) exacerbation: Secondary | ICD-10-CM | POA: Diagnosis not present

## 2019-11-07 DIAGNOSIS — E118 Type 2 diabetes mellitus with unspecified complications: Secondary | ICD-10-CM | POA: Diagnosis not present

## 2019-11-07 DIAGNOSIS — I779 Disorder of arteries and arterioles, unspecified: Secondary | ICD-10-CM | POA: Diagnosis not present

## 2019-11-09 ENCOUNTER — Other Ambulatory Visit: Payer: Self-pay

## 2019-11-09 ENCOUNTER — Inpatient Hospital Stay
Admission: EM | Admit: 2019-11-09 | Discharge: 2019-11-23 | DRG: 871 | Disposition: E | Payer: Medicare HMO | Source: Skilled Nursing Facility | Attending: Family Medicine | Admitting: Family Medicine

## 2019-11-09 ENCOUNTER — Emergency Department: Payer: Medicare HMO

## 2019-11-09 ENCOUNTER — Encounter: Payer: Self-pay | Admitting: Emergency Medicine

## 2019-11-09 DIAGNOSIS — C7971 Secondary malignant neoplasm of right adrenal gland: Secondary | ICD-10-CM | POA: Diagnosis present

## 2019-11-09 DIAGNOSIS — F17210 Nicotine dependence, cigarettes, uncomplicated: Secondary | ICD-10-CM | POA: Diagnosis present

## 2019-11-09 DIAGNOSIS — Z9981 Dependence on supplemental oxygen: Secondary | ICD-10-CM | POA: Diagnosis not present

## 2019-11-09 DIAGNOSIS — Z20822 Contact with and (suspected) exposure to covid-19: Secondary | ICD-10-CM | POA: Diagnosis not present

## 2019-11-09 DIAGNOSIS — R069 Unspecified abnormalities of breathing: Secondary | ICD-10-CM | POA: Diagnosis not present

## 2019-11-09 DIAGNOSIS — Z515 Encounter for palliative care: Secondary | ICD-10-CM | POA: Diagnosis not present

## 2019-11-09 DIAGNOSIS — C7889 Secondary malignant neoplasm of other digestive organs: Secondary | ICD-10-CM | POA: Diagnosis present

## 2019-11-09 DIAGNOSIS — J90 Pleural effusion, not elsewhere classified: Secondary | ICD-10-CM | POA: Diagnosis not present

## 2019-11-09 DIAGNOSIS — J9621 Acute and chronic respiratory failure with hypoxia: Secondary | ICD-10-CM | POA: Diagnosis not present

## 2019-11-09 DIAGNOSIS — R5383 Other fatigue: Secondary | ICD-10-CM | POA: Diagnosis present

## 2019-11-09 DIAGNOSIS — R4182 Altered mental status, unspecified: Secondary | ICD-10-CM

## 2019-11-09 DIAGNOSIS — E785 Hyperlipidemia, unspecified: Secondary | ICD-10-CM | POA: Diagnosis present

## 2019-11-09 DIAGNOSIS — I110 Hypertensive heart disease with heart failure: Secondary | ICD-10-CM | POA: Diagnosis present

## 2019-11-09 DIAGNOSIS — C7951 Secondary malignant neoplasm of bone: Secondary | ICD-10-CM | POA: Diagnosis present

## 2019-11-09 DIAGNOSIS — H9190 Unspecified hearing loss, unspecified ear: Secondary | ICD-10-CM | POA: Diagnosis present

## 2019-11-09 DIAGNOSIS — E119 Type 2 diabetes mellitus without complications: Secondary | ICD-10-CM | POA: Diagnosis present

## 2019-11-09 DIAGNOSIS — C3432 Malignant neoplasm of lower lobe, left bronchus or lung: Secondary | ICD-10-CM | POA: Diagnosis present

## 2019-11-09 DIAGNOSIS — K219 Gastro-esophageal reflux disease without esophagitis: Secondary | ICD-10-CM | POA: Diagnosis present

## 2019-11-09 DIAGNOSIS — C771 Secondary and unspecified malignant neoplasm of intrathoracic lymph nodes: Secondary | ICD-10-CM | POA: Diagnosis not present

## 2019-11-09 DIAGNOSIS — A419 Sepsis, unspecified organism: Secondary | ICD-10-CM | POA: Diagnosis not present

## 2019-11-09 DIAGNOSIS — I509 Heart failure, unspecified: Secondary | ICD-10-CM | POA: Diagnosis not present

## 2019-11-09 DIAGNOSIS — J440 Chronic obstructive pulmonary disease with acute lower respiratory infection: Secondary | ICD-10-CM | POA: Diagnosis present

## 2019-11-09 DIAGNOSIS — Z7982 Long term (current) use of aspirin: Secondary | ICD-10-CM

## 2019-11-09 DIAGNOSIS — M1990 Unspecified osteoarthritis, unspecified site: Secondary | ICD-10-CM | POA: Diagnosis present

## 2019-11-09 DIAGNOSIS — J918 Pleural effusion in other conditions classified elsewhere: Secondary | ICD-10-CM | POA: Diagnosis not present

## 2019-11-09 DIAGNOSIS — Z880 Allergy status to penicillin: Secondary | ICD-10-CM

## 2019-11-09 DIAGNOSIS — I10 Essential (primary) hypertension: Secondary | ICD-10-CM | POA: Diagnosis present

## 2019-11-09 DIAGNOSIS — R0902 Hypoxemia: Secondary | ICD-10-CM

## 2019-11-09 DIAGNOSIS — J9811 Atelectasis: Secondary | ICD-10-CM | POA: Diagnosis not present

## 2019-11-09 DIAGNOSIS — R0602 Shortness of breath: Secondary | ICD-10-CM

## 2019-11-09 DIAGNOSIS — Z66 Do not resuscitate: Secondary | ICD-10-CM | POA: Diagnosis not present

## 2019-11-09 DIAGNOSIS — R5081 Fever presenting with conditions classified elsewhere: Secondary | ICD-10-CM

## 2019-11-09 DIAGNOSIS — C3490 Malignant neoplasm of unspecified part of unspecified bronchus or lung: Secondary | ICD-10-CM | POA: Diagnosis present

## 2019-11-09 DIAGNOSIS — Z803 Family history of malignant neoplasm of breast: Secondary | ICD-10-CM

## 2019-11-09 DIAGNOSIS — J189 Pneumonia, unspecified organism: Secondary | ICD-10-CM | POA: Diagnosis not present

## 2019-11-09 DIAGNOSIS — I248 Other forms of acute ischemic heart disease: Secondary | ICD-10-CM | POA: Diagnosis present

## 2019-11-09 DIAGNOSIS — Y95 Nosocomial condition: Secondary | ICD-10-CM | POA: Diagnosis present

## 2019-11-09 DIAGNOSIS — J910 Malignant pleural effusion: Secondary | ICD-10-CM | POA: Diagnosis present

## 2019-11-09 DIAGNOSIS — R918 Other nonspecific abnormal finding of lung field: Secondary | ICD-10-CM | POA: Diagnosis not present

## 2019-11-09 DIAGNOSIS — E876 Hypokalemia: Secondary | ICD-10-CM | POA: Diagnosis present

## 2019-11-09 DIAGNOSIS — F329 Major depressive disorder, single episode, unspecified: Secondary | ICD-10-CM | POA: Diagnosis present

## 2019-11-09 DIAGNOSIS — Z8249 Family history of ischemic heart disease and other diseases of the circulatory system: Secondary | ICD-10-CM

## 2019-11-09 DIAGNOSIS — Z7189 Other specified counseling: Secondary | ICD-10-CM | POA: Diagnosis not present

## 2019-11-09 DIAGNOSIS — C799 Secondary malignant neoplasm of unspecified site: Secondary | ICD-10-CM | POA: Insufficient documentation

## 2019-11-09 DIAGNOSIS — R531 Weakness: Secondary | ICD-10-CM | POA: Diagnosis not present

## 2019-11-09 DIAGNOSIS — Z794 Long term (current) use of insulin: Secondary | ICD-10-CM

## 2019-11-09 DIAGNOSIS — Z7902 Long term (current) use of antithrombotics/antiplatelets: Secondary | ICD-10-CM

## 2019-11-09 DIAGNOSIS — Z79899 Other long term (current) drug therapy: Secondary | ICD-10-CM

## 2019-11-09 DIAGNOSIS — F17200 Nicotine dependence, unspecified, uncomplicated: Secondary | ICD-10-CM | POA: Diagnosis present

## 2019-11-09 DIAGNOSIS — I5033 Acute on chronic diastolic (congestive) heart failure: Secondary | ICD-10-CM | POA: Diagnosis not present

## 2019-11-09 DIAGNOSIS — C787 Secondary malignant neoplasm of liver and intrahepatic bile duct: Secondary | ICD-10-CM | POA: Diagnosis not present

## 2019-11-09 DIAGNOSIS — J80 Acute respiratory distress syndrome: Secondary | ICD-10-CM | POA: Diagnosis not present

## 2019-11-09 LAB — URINALYSIS, COMPLETE (UACMP) WITH MICROSCOPIC
Bacteria, UA: NONE SEEN
Bilirubin Urine: NEGATIVE
Glucose, UA: NEGATIVE mg/dL
Ketones, ur: NEGATIVE mg/dL
Leukocytes,Ua: NEGATIVE
Nitrite: NEGATIVE
Protein, ur: 100 mg/dL — AB
Specific Gravity, Urine: 1.012 (ref 1.005–1.030)
Squamous Epithelial / HPF: NONE SEEN (ref 0–5)
pH: 6 (ref 5.0–8.0)

## 2019-11-09 LAB — CBC WITH DIFFERENTIAL/PLATELET
Abs Immature Granulocytes: 0.13 10*3/uL — ABNORMAL HIGH (ref 0.00–0.07)
Basophils Absolute: 0 10*3/uL (ref 0.0–0.1)
Basophils Relative: 0 %
Eosinophils Absolute: 0.1 10*3/uL (ref 0.0–0.5)
Eosinophils Relative: 0 %
HCT: 35 % — ABNORMAL LOW (ref 36.0–46.0)
Hemoglobin: 10.8 g/dL — ABNORMAL LOW (ref 12.0–15.0)
Immature Granulocytes: 1 %
Lymphocytes Relative: 10 %
Lymphs Abs: 1.2 10*3/uL (ref 0.7–4.0)
MCH: 27.1 pg (ref 26.0–34.0)
MCHC: 30.9 g/dL (ref 30.0–36.0)
MCV: 87.9 fL (ref 80.0–100.0)
Monocytes Absolute: 0.4 10*3/uL (ref 0.1–1.0)
Monocytes Relative: 4 %
Neutro Abs: 10.1 10*3/uL — ABNORMAL HIGH (ref 1.7–7.7)
Neutrophils Relative %: 85 %
Platelets: 100 10*3/uL — ABNORMAL LOW (ref 150–400)
RBC: 3.98 MIL/uL (ref 3.87–5.11)
RDW: 16.3 % — ABNORMAL HIGH (ref 11.5–15.5)
Smear Review: DECREASED
WBC: 11.9 10*3/uL — ABNORMAL HIGH (ref 4.0–10.5)
nRBC: 0 % (ref 0.0–0.2)

## 2019-11-09 LAB — GLUCOSE, CAPILLARY
Glucose-Capillary: 174 mg/dL — ABNORMAL HIGH (ref 70–99)
Glucose-Capillary: 35 mg/dL — CL (ref 70–99)
Glucose-Capillary: 303 mg/dL — ABNORMAL HIGH (ref 70–99)
Glucose-Capillary: 239 mg/dL — ABNORMAL HIGH (ref 70–99)
Glucose-Capillary: 261 mg/dL — ABNORMAL HIGH (ref 70–99)
Glucose-Capillary: 224 mg/dL — ABNORMAL HIGH (ref 70–99)

## 2019-11-09 LAB — SARS CORONAVIRUS 2 BY RT PCR (HOSPITAL ORDER, PERFORMED IN ~~LOC~~ HOSPITAL LAB): SARS Coronavirus 2: NEGATIVE

## 2019-11-09 LAB — BRAIN NATRIURETIC PEPTIDE: B Natriuretic Peptide: 31.3 pg/mL (ref 0.0–100.0)

## 2019-11-09 LAB — LACTIC ACID, PLASMA: Lactic Acid, Venous: 0.5 mmol/L (ref 0.5–1.9)

## 2019-11-09 LAB — COMPREHENSIVE METABOLIC PANEL
ALT: 25 U/L (ref 0–44)
AST: 24 U/L (ref 15–41)
Albumin: 2.9 g/dL — ABNORMAL LOW (ref 3.5–5.0)
Alkaline Phosphatase: 73 U/L (ref 38–126)
Anion gap: 12 (ref 5–15)
BUN: 23 mg/dL (ref 8–23)
CO2: 30 mmol/L (ref 22–32)
Calcium: 8.9 mg/dL (ref 8.9–10.3)
Chloride: 89 mmol/L — ABNORMAL LOW (ref 98–111)
Creatinine, Ser: 0.92 mg/dL (ref 0.44–1.00)
GFR calc Af Amer: >60 mL/min (ref >60)
GFR calc non Af Amer: >60 mL/min (ref >60)
Glucose, Bld: 56 mg/dL — ABNORMAL LOW (ref 70–99)
Potassium: 3.8 mmol/L (ref 3.5–5.1)
Sodium: 131 mmol/L — ABNORMAL LOW (ref 135–145)
Total Bilirubin: 1 mg/dL (ref 0.3–1.2)
Total Protein: 5.9 g/dL — ABNORMAL LOW (ref 6.5–8.1)

## 2019-11-09 LAB — HEMOGLOBIN A1C
Hgb A1c MFr Bld: 6.8 % — ABNORMAL HIGH (ref 4.8–5.6)
Mean Plasma Glucose: 148.46 mg/dL

## 2019-11-09 LAB — TROPONIN I (HIGH SENSITIVITY)
Troponin I (High Sensitivity): 32 ng/L — ABNORMAL HIGH (ref <18)
Troponin I (High Sensitivity): 36 ng/L — ABNORMAL HIGH (ref <18)

## 2019-11-09 LAB — HIV ANTIBODY (ROUTINE TESTING W REFLEX): HIV Screen 4th Generation wRfx: NONREACTIVE

## 2019-11-09 LAB — MAGNESIUM: Magnesium: 2 mg/dL (ref 1.7–2.4)

## 2019-11-09 MED ORDER — POLYETHYLENE GLYCOL 3350 17 G PO PACK
17.0000 g | PACK | Freq: Every day | ORAL | Status: DC
  Administered 2019-11-10: 17 g via ORAL
  Filled 2019-11-09 (×2): qty 1

## 2019-11-09 MED ORDER — INSULIN ASPART 100 UNIT/ML ~~LOC~~ SOLN: 0.0000 [IU] | SUBCUTANEOUS | Status: DC

## 2019-11-09 MED ORDER — SODIUM CHLORIDE 0.9% FLUSH: 3.0000 mL | INTRAVENOUS | Status: DC | PRN

## 2019-11-09 MED ORDER — CENTRAVITES 50 PLUS PO TABS: 1.0000 | ORAL_TABLET | Freq: Every day | ORAL | Status: DC

## 2019-11-09 MED ORDER — INSULIN ASPART 100 UNIT/ML ~~LOC~~ SOLN: 0.0000 [IU] | Freq: Three times a day (TID) | SUBCUTANEOUS | Status: DC

## 2019-11-09 MED ORDER — ROSUVASTATIN CALCIUM 10 MG PO TABS
10.0000 mg | ORAL_TABLET | Freq: Every day | ORAL | Status: DC
  Administered 2019-11-10 – 2019-11-11 (×2): 10 mg via ORAL
  Filled 2019-11-09 (×3): qty 1

## 2019-11-09 MED ORDER — LOSARTAN POTASSIUM 50 MG PO TABS
100.0000 mg | ORAL_TABLET | Freq: Every day | ORAL | Status: DC
  Administered 2019-11-09 – 2019-11-11 (×3): 100 mg via ORAL
  Filled 2019-11-09 (×4): qty 2

## 2019-11-09 MED ORDER — VITAMIN B-12 1000 MCG PO TABS
1000.0000 ug | ORAL_TABLET | Freq: Every day | ORAL | Status: DC
  Administered 2019-11-10 – 2019-11-11 (×2): 1000 ug via ORAL
  Filled 2019-11-09 (×3): qty 1

## 2019-11-09 MED ORDER — NICOTINE 14 MG/24HR TD PT24
14.0000 mg | MEDICATED_PATCH | Freq: Every day | TRANSDERMAL | Status: DC
  Administered 2019-11-09 – 2019-11-12 (×4): 14 mg via TRANSDERMAL
  Filled 2019-11-09 (×4): qty 1

## 2019-11-09 MED ORDER — ENOXAPARIN SODIUM 40 MG/0.4ML ~~LOC~~ SOLN
40.0000 mg | SUBCUTANEOUS | Status: DC
  Administered 2019-11-09 – 2019-11-10 (×2): 40 mg via SUBCUTANEOUS
  Filled 2019-11-09 (×2): qty 0.4

## 2019-11-09 MED ORDER — SODIUM CHLORIDE 0.9 % IV SOLN
2.0000 g | Freq: Three times a day (TID) | INTRAVENOUS | Status: DC
  Administered 2019-11-09 – 2019-11-11 (×5): 2 g via INTRAVENOUS
  Filled 2019-11-09 (×7): qty 2

## 2019-11-09 MED ORDER — DEXTROSE-NACL 5-0.9 % IV SOLN: INTRAVENOUS | Status: DC

## 2019-11-09 MED ORDER — ASPIRIN EC 81 MG PO TBEC
81.0000 mg | DELAYED_RELEASE_TABLET | Freq: Every day | ORAL | Status: DC
  Administered 2019-11-10 – 2019-11-11 (×2): 81 mg via ORAL
  Filled 2019-11-09 (×2): qty 1

## 2019-11-09 MED ORDER — CLOPIDOGREL BISULFATE 75 MG PO TABS
75.0000 mg | ORAL_TABLET | Freq: Every day | ORAL | Status: DC
  Administered 2019-11-10 – 2019-11-11 (×2): 75 mg via ORAL
  Filled 2019-11-09 (×2): qty 1

## 2019-11-09 MED ORDER — CLONAZEPAM 0.5 MG PO TABS
0.5000 mg | ORAL_TABLET | Freq: Two times a day (BID) | ORAL | Status: DC
  Administered 2019-11-09 – 2019-11-12 (×6): 0.5 mg via ORAL
  Filled 2019-11-09 (×6): qty 1

## 2019-11-09 MED ORDER — MORPHINE SULFATE (PF) 2 MG/ML IV SOLN
2.0000 mg | INTRAVENOUS | Status: DC | PRN
  Administered 2019-11-09 – 2019-11-10 (×2): 2 mg via INTRAVENOUS
  Filled 2019-11-09 (×2): qty 1

## 2019-11-09 MED ORDER — PREDNISONE 20 MG PO TABS
20.0000 mg | ORAL_TABLET | Freq: Every day | ORAL | Status: DC
  Administered 2019-11-09 – 2019-11-11 (×3): 20 mg via ORAL
  Filled 2019-11-09 (×3): qty 1

## 2019-11-09 MED ORDER — SENNA 8.6 MG PO TABS
2.0000 | ORAL_TABLET | Freq: Every evening | ORAL | Status: DC | PRN
  Filled 2019-11-09: qty 2

## 2019-11-09 MED ORDER — DEXTROSE 50 % IV SOLN
1.0000 | Freq: Once | INTRAVENOUS | Status: AC
  Administered 2019-11-09: 50 mL via INTRAVENOUS

## 2019-11-09 MED ORDER — LIDOCAINE 5 % EX PTCH
1.0000 | MEDICATED_PATCH | CUTANEOUS | Status: DC
  Administered 2019-11-09 – 2019-11-12 (×4): 1 via TRANSDERMAL
  Filled 2019-11-09 (×6): qty 1

## 2019-11-09 MED ORDER — ESCITALOPRAM OXALATE 10 MG PO TABS
10.0000 mg | ORAL_TABLET | Freq: Every day | ORAL | Status: DC
  Administered 2019-11-09 – 2019-11-12 (×3): 10 mg via ORAL
  Filled 2019-11-09 (×4): qty 1

## 2019-11-09 MED ORDER — SODIUM CHLORIDE 0.9 % IV SOLN
250.0000 mL | INTRAVENOUS | Status: DC | PRN
  Administered 2019-11-10: 50 mL via INTRAVENOUS

## 2019-11-09 MED ORDER — SODIUM CHLORIDE 0.9 % IV SOLN
500.0000 mg | INTRAVENOUS | Status: DC
  Administered 2019-11-09 – 2019-11-10 (×2): 500 mg via INTRAVENOUS
  Filled 2019-11-09 (×4): qty 500

## 2019-11-09 MED ORDER — INSULIN ASPART 100 UNIT/ML ~~LOC~~ SOLN
0.0000 [IU] | Freq: Three times a day (TID) | SUBCUTANEOUS | Status: DC
  Administered 2019-11-09: 5 [IU] via SUBCUTANEOUS
  Administered 2019-11-10: 3 [IU] via SUBCUTANEOUS
  Administered 2019-11-10: 5 [IU] via SUBCUTANEOUS
  Administered 2019-11-11 (×2): 3 [IU] via SUBCUTANEOUS
  Filled 2019-11-09 (×5): qty 1

## 2019-11-09 MED ORDER — SODIUM CHLORIDE 0.9 % IV SOLN
2.0000 g | Freq: Once | INTRAVENOUS | Status: AC
  Administered 2019-11-09: 2 g via INTRAVENOUS
  Filled 2019-11-09: qty 2

## 2019-11-09 MED ORDER — SODIUM CHLORIDE 0.9% FLUSH
3.0000 mL | Freq: Two times a day (BID) | INTRAVENOUS | Status: DC
  Administered 2019-11-10 – 2019-11-12 (×4): 3 mL via INTRAVENOUS

## 2019-11-09 MED ORDER — FLUTICASONE PROPIONATE 50 MCG/ACT NA SUSP
2.0000 | Freq: Every day | NASAL | Status: DC
  Filled 2019-11-09 (×2): qty 16

## 2019-11-09 MED ORDER — MELATONIN 5 MG PO TABS
5.0000 mg | ORAL_TABLET | Freq: Every day | ORAL | Status: DC
  Administered 2019-11-09 – 2019-11-11 (×3): 5 mg via ORAL
  Filled 2019-11-09 (×4): qty 1

## 2019-11-09 MED ORDER — ADULT MULTIVITAMIN W/MINERALS CH
1.0000 | ORAL_TABLET | Freq: Every day | ORAL | Status: DC
  Administered 2019-11-10 – 2019-11-11 (×2): 1 via ORAL
  Filled 2019-11-09 (×2): qty 1

## 2019-11-09 MED ORDER — VANCOMYCIN HCL 2000 MG/400ML IV SOLN
2000.0000 mg | Freq: Once | INTRAVENOUS | Status: AC
  Administered 2019-11-09: 2000 mg via INTRAVENOUS
  Filled 2019-11-09: qty 400

## 2019-11-09 MED ORDER — DEXTROSE 50 % IV SOLN
INTRAVENOUS | Status: AC
  Filled 2019-11-09: qty 50

## 2019-11-09 MED ORDER — ASCORBIC ACID 500 MG PO TABS
500.0000 mg | ORAL_TABLET | Freq: Every day | ORAL | Status: DC
  Administered 2019-11-10 – 2019-11-11 (×2): 500 mg via ORAL
  Filled 2019-11-09 (×3): qty 1

## 2019-11-09 MED ORDER — IOHEXOL 350 MG/ML SOLN
75.0000 mL | Freq: Once | INTRAVENOUS | Status: AC | PRN
  Administered 2019-11-09: 75 mL via INTRAVENOUS

## 2019-11-09 MED ORDER — ALBUTEROL SULFATE (2.5 MG/3ML) 0.083% IN NEBU
2.5000 mg | INHALATION_SOLUTION | RESPIRATORY_TRACT | Status: DC | PRN
  Administered 2019-11-09 – 2019-11-12 (×7): 2.5 mg via RESPIRATORY_TRACT
  Filled 2019-11-09 (×9): qty 3

## 2019-11-09 MED ORDER — ACETAMINOPHEN 500 MG PO TABS
1000.0000 mg | ORAL_TABLET | Freq: Three times a day (TID) | ORAL | Status: DC | PRN
  Administered 2019-11-11: 1000 mg via ORAL

## 2019-11-09 NOTE — ED Notes (Signed)
In and out cath with sterile technique maintained.  Ally RN at bedside with this RN

## 2019-11-09 NOTE — ED Notes (Signed)
Will bring in home meds once verified. Pharmacy aware of need to verify.

## 2019-11-09 NOTE — ED Notes (Signed)
Admitting MD at bedside for eval; daughter also at bedside. Pitting edema of b/l lower extremities noted on exam.

## 2019-11-09 NOTE — ED Triage Notes (Signed)
From [facility] for [facility].  New lung CA dx.  Being tx for PNA.  sats in low 80s on baseline 3 L [place].  Currently on 6 L [place].  Slightly drowsy mental status.  Labored. BLE swelling

## 2019-11-09 NOTE — ED Notes (Signed)
CBG low. Dr agbata aware.  Orders received.

## 2019-11-09 NOTE — ED Notes (Signed)
Called and requested pharmacy verify meds so can pull them

## 2019-11-09 NOTE — ED Notes (Signed)
Pt given sandwich tray and drink.  Sitting up eating. Family remains at bedside.

## 2019-11-09 NOTE — Consult Note (Signed)
PHARMACY -  BRIEF ANTIBIOTIC NOTE   Pharmacy has received consult(s) for Vancomycin from an ED provider.  The patient's profile has been reviewed for ht/wt/allergies/indication/available labs.    One time order(s) placed for Vancomycin 2000mg  IV x 1  Further antibiotics/pharmacy consults should be ordered by admitting physician if indicated.                       Thank you,  Lu Duffel, PharmD, BCPS Clinical Pharmacist 11/08/2019 11:03 AM

## 2019-11-09 NOTE — Progress Notes (Signed)
Pharmacy Antibiotic Note  Holly Reyes is a 74 y.o. female admitted on 11/21/2019 with pneumonia.  Pharmacy has been consulted for cefepime dosing. Pt also on azithromycin.   Plan: Cefepime 2 g IV q8h   Weight: 104.3 kg (230 lb)  Temp (24hrs), Avg:100.5 F (38.1 C), Min:100.4 F (38 C), Max:100.5 F (38.1 C)  Recent Labs  Lab 10/30/2019 0838  WBC 11.9*  CREATININE 0.92  LATICACIDVEN 0.5    Estimated Creatinine Clearance: 70 mL/min (by C-G formula based on SCr of 0.92 mg/dL).    Allergies  Allergen Reactions  . Penicillins Rash    Has patient had a PCN reaction causing immediate rash, facial/tongue/throat swelling, SOB or lightheadedness with hypotension: No Has patient had a PCN reaction causing severe rash involving mucus membranes or skin necrosis: No Has patient had a PCN reaction that required hospitalization: No Has patient had a PCN reaction occurring within the last 10 years: No If all of the above answers are "NO", then may proceed with Cephalosporin use.     Antimicrobials this admission:   Dose adjustments this admission:   Microbiology results: 8/18 BCx: collected 8/18 Sputum: ordered   Thank you for allowing pharmacy to be a part of this patient's care.  Rocky Morel 10/26/2019 3:41 PM
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
## 2019-11-09 NOTE — ED Notes (Signed)
Pt returned from CT scan. Family remains at bedside.
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
## 2019-11-10 ENCOUNTER — Other Ambulatory Visit: Payer: Self-pay

## 2019-11-10 ENCOUNTER — Encounter: Payer: Self-pay | Admitting: Internal Medicine

## 2019-11-10 DIAGNOSIS — R0902 Hypoxemia: Secondary | ICD-10-CM

## 2019-11-10 LAB — CBC
HCT: 28 % — ABNORMAL LOW (ref 36.0–46.0)
Hemoglobin: 8.7 g/dL — ABNORMAL LOW (ref 12.0–15.0)
MCH: 27.3 pg (ref 26.0–34.0)
MCHC: 31.1 g/dL (ref 30.0–36.0)
MCV: 87.8 fL (ref 80.0–100.0)
Platelets: 82 10*3/uL — ABNORMAL LOW (ref 150–400)
RBC: 3.19 MIL/uL — ABNORMAL LOW (ref 3.87–5.11)
RDW: 16 % — ABNORMAL HIGH (ref 11.5–15.5)
WBC: 8.9 10*3/uL (ref 4.0–10.5)
nRBC: 0 % (ref 0.0–0.2)

## 2019-11-10 LAB — GLUCOSE, CAPILLARY
Glucose-Capillary: 33 mg/dL — CL (ref 70–99)
Glucose-Capillary: 203 mg/dL — ABNORMAL HIGH (ref 70–99)
Glucose-Capillary: 282 mg/dL — ABNORMAL HIGH (ref 70–99)
Glucose-Capillary: 244 mg/dL — ABNORMAL HIGH (ref 70–99)
Glucose-Capillary: 241 mg/dL — ABNORMAL HIGH (ref 70–99)
Glucose-Capillary: 180 mg/dL — ABNORMAL HIGH (ref 70–99)

## 2019-11-10 LAB — BASIC METABOLIC PANEL
Anion gap: 11 (ref 5–15)
BUN: 23 mg/dL (ref 8–23)
CO2: 27 mmol/L (ref 22–32)
Calcium: 8.1 mg/dL — ABNORMAL LOW (ref 8.9–10.3)
Chloride: 94 mmol/L — ABNORMAL LOW (ref 98–111)
Creatinine, Ser: 0.9 mg/dL (ref 0.44–1.00)
GFR calc Af Amer: >60 mL/min (ref >60)
GFR calc non Af Amer: >60 mL/min (ref >60)
Glucose, Bld: 306 mg/dL — ABNORMAL HIGH (ref 70–99)
Potassium: 4.2 mmol/L (ref 3.5–5.1)
Sodium: 132 mmol/L — ABNORMAL LOW (ref 135–145)

## 2019-11-10 LAB — LACTIC ACID, PLASMA
Lactic Acid, Venous: 1.7 mmol/L (ref 0.5–1.9)
Lactic Acid, Venous: 1.4 mmol/L (ref 0.5–1.9)

## 2019-11-10 LAB — PROCALCITONIN: Procalcitonin: 0.17 ng/mL

## 2019-11-10 MED ORDER — FUROSEMIDE 10 MG/ML IJ SOLN
40.0000 mg | Freq: Once | INTRAMUSCULAR | Status: AC
  Administered 2019-11-10: 40 mg via INTRAVENOUS
  Filled 2019-11-10: qty 4

## 2019-11-10 MED ORDER — SODIUM CHLORIDE 0.9 % IV SOLN: INTRAVENOUS | Status: DC

## 2019-11-10 MED ORDER — GUAIFENESIN-DM 100-10 MG/5ML PO SYRP
10.0000 mL | ORAL_SOLUTION | Freq: Three times a day (TID) | ORAL | Status: DC
  Administered 2019-11-10 – 2019-11-11 (×5): 10 mL via ORAL
  Filled 2019-11-10 (×6): qty 10

## 2019-11-10 MED ORDER — CALCIUM GLUCONATE-NACL 2-0.675 GM/100ML-% IV SOLN
2.0000 g | Freq: Once | INTRAVENOUS | Status: DC
  Filled 2019-11-10: qty 100

## 2019-11-10 MED ORDER — SODIUM CHLORIDE 0.9 % IV SOLN: 2.0000 g | Freq: Once | INTRAVENOUS | Status: DC

## 2019-11-10 MED ORDER — POTASSIUM CHLORIDE CRYS ER 20 MEQ PO TBCR
40.0000 meq | EXTENDED_RELEASE_TABLET | Freq: Once | ORAL | Status: AC
  Administered 2019-11-10: 40 meq via ORAL
  Filled 2019-11-10: qty 2

## 2019-11-10 NOTE — Progress Notes (Signed)
MD notified via secure chat. Pt complains of SOB. Lungs show exp wheeze to ascultation and oxygen saturation is 94-96% on 6L via [place]. Pt has significant 2-3+ BLE edema. Respiratory is notified for assessment. MD orders one dose of IV lasix. I will continue to assess.

## 2019-11-10 NOTE — Consult Note (Signed)
Consultation Note Date: 11/10/2019   Patient Name: Holly Reyes  DOB: [birth year]  MRN: [id number]  Age / Sex: 74 y.o., female   PCP: Philmore Pali, NP Referring Physician: Deatra James, MD   REASON FOR CONSULTATION:Establishing goals of care  Palliative Care consult requested for goals of care discussion in this 74 y.o. female with multiple medical problems including bilateral metastatic lung adenocarcinoma with hepatic and splenic masses, ischemic stroke, peripheral artery disease, hypertension, chronic kidney disease stage III, chronic pain, tobacco use, ID, hyperlipidemia, pruritus, and asthma.  Patient recently admitted at [facility] on 10/17/2019 for bilateral pneumonia.  She was subsequently discharged to a rehabilitation center.  Patient admitted for bilateral pneumonia and sepsis.  She is receiving IV antibiotics and oxygen for support.  Clinical Assessment and Goals of Care: I have reviewed medical records including lab results, imaging, Epic notes, and MAR, received report from the bedside RN, and assessed the patient. I met at the bedside with patient to discuss diagnosis prognosis, GOC, EOL wishes, disposition and options. Patient with increased work of breathing and requesting I speak with her daughter/HCPOA Elaina Pattee. I was able to reach [facility] by phone and have [place] discussion.   Ms. Cove is awake, alert and oriented x3. She recently had a bowel movement on the bedpan and has increased work of breathing. Noticeable use of accessory muscles and unable to speak in complete sentences without taking breaks for breath. She is on 6L/[place].   I introduced Palliative Medicine as specialized medical care for people living with serious illness. It focuses on providing relief from the symptoms and stress of a serious illness. The goal is to improve quality of life for both the patient and the family. Patient and daughter verbalized understanding.  Per daughter patient was seen by the  palliative medicine team when hospitalized at [facility].  We discussed a brief life review of the patient, along with her functional and nutritional status.  Patient has 1 daughter.  Prior to admission she was placed in rehabilitation after recent hospitalization.  Patient originally lived alone with daughter's assistance.  Daughter reports continued decline over the past 2 to 4 weeks.  She reports decrease in patient's appetite, increasing shortness of breath and weakness.  Patient required assistance with ADLs due to increased fatigue and easily short of breath with exertion.  We discussed Her current illness and what it means in the larger context of Her on-going co-morbidities. With specific discussions regarding her recurrent bilateral pneumonia, metastatic lung cancer, and patient's overall functional and nutritional decline.  Natural disease trajectory and expectations at EOL were discussed.  Daughter is tearful during conversation expressing her feelings that her mother is suffering and does not want to see her in her current state.  She is aware of her mother's poor prognosis and comorbidities.  Daughter reports they have been seen by oncology with recommendations for continued testing of pathology with the possibility of immunotherapy.Stanford Breed shares that her mother has made it known she is not interested in chemotherapy or radiation therapy with awareness that her condition is incurable.  I attempted to elicit values and goals of care important to the patient.  Stanford Breed reports her mother originally requested to treat the treatable and prolong her life as much as possible after being told it was not time to discuss a palliative approach and to allow several days to try to treat her symptoms and conditions.  Daughter shares her frustration knowing her mother is suffering  and being told there are things that can be treated however not being told that the underlying condition of her metastatic cancer will  never go away and will continue to cause health complications.  Therapeutic listening and support provided.    The difference between aggressive medical intervention and comfort care was considered in light of the patient's goals of care. I educated patient/family on what comfort care measures would look like.  Daughter is tearful sharing she feels her mother is most appropriate for hospice care and does not wish to continue to watch her suffer.  She shares observations of her mother becoming increasingly fatigued and signs of increased work of breathing just with conversation.  Emotional support provided.  Stanford Breed would like to further discuss face-to-face at bedside with her mother allowing her to assist in making final decisions.  Daughter expresses she wants both she and her mother to be comfortable with their decisions while making sure patient's best interest is driving their decision.  Daughter tearfully verbalizes her mother's poor quality of life and continues to refer to her obvious signs of suffering.  Advanced directives, concepts specific to code status, artifical feeding and hydration, and rehospitalization were considered and discussed. Patient does have a documented advanced directive.  Both patient and daughter confirms wishes for DNR/DNI.  Hospice and Palliative Care services outpatient were explained and offered.  Daughter verbalized her understanding and awareness of both palliative and hospice's goals and philosophy of care. Stanford Breed reports she is leaning towards a more comfort/EOL approach.  Although she is not emotionally ready to know that her mother may be passing away soon she also knows that she would not suffer and will be in a better place which brings her peace and comfort with her feelings.  We discussed outpatient hospice in the home versus residential hospice facility.  Daughter verbalizes once final decisions are made residential hospice would be their choice.  Questions and  concerns were addressed. The family was encouraged to call with questions or concerns.  PMT will continue to support holistically.   SOCIAL HISTORY:     reports that she has been smoking cigarettes. She has been smoking about 0.50 packs per day. She has never used smokeless tobacco. She reports current alcohol use. She reports that she does not use drugs.  CODE STATUS: DNR  ADVANCE DIRECTIVES: Elaina Pattee (daughter)    SYMPTOM MANAGEMENT: per attending   Palliative Prophylaxis:   Aspiration, Bowel Regimen, Eye Care, Frequent Pain Assessment, Oral Care and Turn Reposition  PSYCHO-SOCIAL/SPIRITUAL:  Support System: Family  Desire for further Chaplaincy support: Yes  Additional Recommendations (Limitations, Scope, Preferences):  DNR, treat the treatable, no escalation of care  Education on hospice/palliative    PAST MEDICAL HISTORY: Past Medical History:  Diagnosis Date  . Arthritis   . Asthma   . GERD (gastroesophageal reflux disease)   . HOH (hard of hearing)   . Hypertension     ALLERGIES:  is allergic to penicillins.   MEDICATIONS:  Current Facility-Administered Medications  Medication Dose Route Frequency Provider Last Rate Last Admin  . 0.9 %  sodium chloride infusion  250 mL Intravenous PRN Agbata, Tochukwu, MD      . 0.9 %  sodium chloride infusion   Intravenous Continuous Lang Snow, NP 75 mL/hr at 11/10/19 0630 New Bag at 11/10/19 0630  . acetaminophen (TYLENOL) tablet 1,000 mg  1,000 mg Oral Q8H PRN Agbata, Tochukwu, MD      . albuterol (PROVENTIL) (2.5 MG/3ML) 0.083%  nebulizer solution 2.5 mg  2.5 mg Inhalation Q4H PRN Agbata, Tochukwu, MD   2.5 mg at 11/10/19 0548  . ascorbic acid (VITAMIN C) tablet 500 mg  500 mg Oral Daily Agbata, Tochukwu, MD      . aspirin EC tablet 81 mg  81 mg Oral Daily Agbata, Tochukwu, MD      . azithromycin (ZITHROMAX) 500 mg in sodium chloride 0.9 % 250 mL IVPB  500 mg Intravenous Q24H Agbata, Tochukwu, MD    Stopped at 11/22/2019 1840  . calcium gluconate 2 g/ 100 mL sodium chloride IVPB  2 g Intravenous Once Shanlever, Pierce Crane, RPH      . ceFEPIme (MAXIPIME) 2 g in sodium chloride 0.9 % 100 mL IVPB  2 g Intravenous Q8H Rocky Morel, RPH   Stopped at 11/10/19 4174  . clonazePAM (KLONOPIN) tablet 0.5 mg  0.5 mg Oral BID Agbata, Tochukwu, MD   0.5 mg at 11/03/2019 2326  . clopidogrel (PLAVIX) tablet 75 mg  75 mg Oral Daily Agbata, Tochukwu, MD      . enoxaparin (LOVENOX) injection 40 mg  40 mg Subcutaneous Q24H Agbata, Tochukwu, MD   40 mg at 10/30/2019 2326  . escitalopram (LEXAPRO) tablet 10 mg  10 mg Oral Daily Agbata, Tochukwu, MD   10 mg at 10/31/2019 1849  . fluticasone (FLONASE) 50 MCG/ACT nasal spray 2 spray  2 spray Each Nare Daily Agbata, Tochukwu, MD      . insulin aspart (novoLOG) injection 0-15 Units  0-15 Units Subcutaneous TID AC & HS Agbata, Tochukwu, MD   5 Units at 11/19/2019 2348  . lidocaine (LIDODERM) 5 % 1 patch  1 patch Transdermal Q24H Agbata, Tochukwu, MD   1 patch at 10/23/2019 1847  . losartan (COZAAR) tablet 100 mg  100 mg Oral Daily Agbata, Tochukwu, MD   100 mg at 11/14/2019 1850  . melatonin tablet 5 mg  5 mg Oral QHS Agbata, Tochukwu, MD   5 mg at 10/25/2019 2348  . morphine 2 MG/ML injection 2 mg  2 mg Intravenous Q4H PRN Agbata, Tochukwu, MD   2 mg at 11/07/2019 1935  . multivitamin with minerals tablet 1 tablet  1 tablet Oral Daily Agbata, Tochukwu, MD      . nicotine (NICODERM CQ - dosed in mg/24 hours) patch 14 mg  14 mg Transdermal Daily Agbata, Tochukwu, MD   14 mg at 11/16/2019 1849  . polyethylene glycol (MIRALAX / GLYCOLAX) packet 17 g  17 g Oral Daily Agbata, Tochukwu, MD      . potassium chloride SA (KLOR-CON) CR tablet 40 mEq  40 mEq Oral Once Shahmehdi, Seyed A, MD      . predniSONE (DELTASONE) tablet 20 mg  20 mg Oral Daily Agbata, Tochukwu, MD   20 mg at 11/02/2019 1850  . rosuvastatin (CRESTOR) tablet 10 mg  10 mg Oral Daily Agbata, Tochukwu, MD      . senna (SENOKOT)  tablet 17.2 mg  2 tablet Oral QHS PRN Agbata, Tochukwu, MD      . sodium chloride flush (NS) 0.9 % injection 3 mL  3 mL Intravenous Q12H Agbata, Tochukwu, MD      . sodium chloride flush (NS) 0.9 % injection 3 mL  3 mL Intravenous PRN Agbata, Tochukwu, MD      . vitamin B-12 (CYANOCOBALAMIN) tablet 1,000 mcg  1,000 mcg Oral Daily Agbata, Tochukwu, MD       Current Outpatient Medications  Medication Sig Dispense Refill  . acetaminophen (  TYLENOL) 500 MG tablet Take 1,000 mg by mouth every 8 (eight) hours as needed.     [name] albuterol (PROVENTIL) (2.5 MG/3ML) 0.083% nebulizer solution Inhale into the lungs. 1 unit inhale orally four times a day for SOB    . albuterol (VENTOLIN HFA) 108 (90 Base) MCG/ACT inhaler Inhale into the lungs.    [name] amLODipine (NORVASC) 5 MG tablet Take 5 mg by mouth daily.     [name] ascorbic acid (VITAMIN C) 500 MG tablet Take 500 mg by mouth daily.     [name] aspirin EC 81 MG tablet Take 81 mg by mouth daily.    . Biotin (BIOTIN 5000) 5 MG CAPS Take 5 mg by mouth daily.    . cholecalciferol (VITAMIN D) 400 units TABS tablet Take 400 Units by mouth daily.    . clonazePAM (KLONOPIN) 0.5 MG tablet Take 0.5 mg by mouth 2 (two) times daily.     . clopidogrel (PLAVIX) 75 MG tablet TAKE 1 TABLET BY MOUTH EVERY DAY 90 tablet 1  . escitalopram (LEXAPRO) 10 MG tablet Take by mouth.     . fluticasone (FLONASE) 50 MCG/ACT nasal spray Place 2 sprays into both nostrils daily.    . furosemide (LASIX) 20 MG tablet Take 20 mg by mouth.    . gabapentin (NEURONTIN) 300 MG capsule Take 300 mg by mouth 3 (three) times daily.     [name] guaifenesin (ROBITUSSIN) 100 MG/5ML syrup Take 20 mLs by mouth in the morning, at noon, and at bedtime.     . hydrochlorothiazide (HYDRODIURIL) 25 MG tablet Take 25 mg by mouth daily.     . insulin glargine (LANTUS) 100 UNIT/ML injection Inject 25 Units into the skin at bedtime.     . insulin lispro (HUMALOG) 100 UNIT/ML injection Inject 7 Units into the skin 3 (three) times  daily with meals.     [name] ipratropium (ATROVENT) 0.02 % nebulizer solution Inhale into the lungs. 1 unit inhale orally four times a day for Lung CA    . lidocaine (LIDODERM) 5 % Place 1 patch onto the skin. One time a day for mild pain remove at bedtime.    [name] losartan (COZAAR) 100 MG tablet Take 100 mg by mouth daily.     . melatonin 3 MG TABS tablet Take 6 mg by mouth at bedtime.     . Multiple Vitamin (MULTI-VITAMIN) tablet Take 1 tablet by mouth daily.    . Multiple Vitamins-Minerals (CENTRAVITES 50 PLUS) TABS Take 1 tablet by mouth daily.     . nicotine (NICODERM CQ - DOSED IN MG/24 HOURS) 14 mg/24hr patch Place onto the skin.    [name] ondansetron (ZOFRAN-ODT) 8 MG disintegrating tablet Take by mouth.    . polyethylene glycol powder (GLYCOLAX/MIRALAX) 17 GM/SCOOP powder Take by mouth.    . potassium chloride (KLOR-CON) 10 MEQ tablet Take 10 mEq by mouth daily. Give 1 table PO every 24 hours  as needed for edema with Lasix    . rosuvastatin (CRESTOR) 10 MG tablet Take 10 mg by mouth daily.    [name] senna (SENOKOT) 8.6 MG tablet Take by mouth.    . vitamin B-12 (CYANOCOBALAMIN) 500 MCG tablet Take by mouth.    . vitamin E 200 UNIT capsule Take 200 Units by mouth daily.    . Black Elderberry,Berry-Flower, 575 MG CAPS Take by mouth.    . predniSONE (DELTASONE) 10 MG tablet Take 20 mg by mouth daily.     Derrill Memo ON 11/11/2019] predniSONE (  DELTASONE) 10 MG tablet Take 10 mg by mouth daily with breakfast.      VITAL SIGNS: BP (!) 116/55   Pulse 77   Temp 99.3 F (37.4 C) (Oral)   Resp 19   Wt 104.3 kg   SpO2 99%   BMI 33.97 kg/m  Filed Weights   11/18/2019 0833  Weight: 104.3 kg    Estimated body mass index is 33.97 kg/m as calculated from the following:   Height as of 06/09/19: '5\' 9"'  (1.753 m).   Weight as of this encounter: 104.3 kg.  LABS: CBC:    Component Value Date/Time   WBC 8.9 11/10/2019 0800   HGB 8.7 (L) 11/10/2019 0800   HCT 28.0 (L) 11/10/2019 0800   PLT 82 (L) 11/10/2019  0800   Comprehensive Metabolic Panel:    Component Value Date/Time   NA 132 (L) 11/10/2019 0743   K 4.2 11/10/2019 0743   K 3.6 09/07/2012 1449   BUN 23 11/10/2019 0743   CREATININE 0.90 11/10/2019 0743   ALBUMIN 2.9 (L) 11/12/2019 7014     Review of Systems  Constitutional: Positive for activity change and appetite change.  Respiratory: Positive for shortness of breath.   Neurological: Positive for weakness.  Unless otherwise noted, a complete review of systems is negative.  Physical Exam General: Increased work of breathing, frail chronically-ill appearing Cardiovascular: Tachycardia Pulmonary: Wheezing, diminished bilaterally, use of accessory muscles Abdomen: soft, nontender, + bowel sounds Extremities: Bilateral lower extremity pitting edema, no joint deformities Skin: no rashes, warm and dry Neurological: Awake, alert and oriented x3, mood appropriate   Prognosis: Poor in the setting of metastatic lung adenocarcinoma with hepatic/splenic involvement, hypertension, ischemic stroke, generalized weakness, hypoxia, CKD 3, deconditioning, bilateral pneumonia (recurrent).  Discharge Planning:  To Be Determined  Recommendations: . DNR/DNI/-as confirmed by patient and daughter . Continue with current plan of care per medical team  . Daughter is leaning towards comfort and hospice. Tearful not wanting to see her mother continue to suffer. Would like to meet tomorrow 11/11/19 @ 1130 at bedside for final discussions and decisions. Realistic and understands her mother's poor prognosis and incurable metastatic cancer.  [name] PMT will continue to support and follow. Please call team line with urgent needs.   Palliative Performance Scale: PPS 30%              Daughter expressed understanding and was in agreement with this plan.   Thank you for allowing the Palliative Medicine Team to assist in the care of this patient.  Time In: 1520 Time Out:1635 Time Total: 75 min.   Visit  consisted of counseling and education dealing with the complex and emotionally intense issues of symptom management and palliative care in the setting of serious and potentially life-threatening illness.Greater than 50%  of this time was spent counseling and coordinating care related to the above assessment and plan.  Signed by:  Alda Lea, AGPCNP-BC Palliative Medicine Team  Phone: [phone] Pager: [phone] Amion: Bjorn Pippin

## 2019-11-10 NOTE — Progress Notes (Signed)
Pt complains of shortness of breath. Lungs show exp wheeze to ascultation. PRN albuterol given. I will continue to assess.

## 2019-11-10 NOTE — ED Notes (Signed)
Will hold calcium until new labs result

## 2019-11-10 NOTE — ED Notes (Signed)
Admitting provider messaged back and stated bedside evaluation for swallowing and advance diet as tolerated questionable per speech for evaluation - order placed for speech/swallowing eval

## 2019-11-10 NOTE — ED Notes (Signed)
Admitting provider messaged back and gave no new orders at this time

## 2019-11-10 NOTE — ED Notes (Signed)
Admitting provider messaged to notify of critical Ca+ of 5.9 Awaiting new orders

## 2019-11-10 NOTE — ED Notes (Addendum)
Lab called and states that they do not think that the am lab results are accurate - they are requesting a recollect of the lavender and green top tubes - samples collected at this time  Messaged admitting provider to notify of redraw

## 2019-11-10 NOTE — Progress Notes (Signed)
Inpatient Diabetes Program Recommendations  AACE/ADA: New Consensus Statement on Inpatient Glycemic Control (2015)  Target Ranges:  Prepandial:   less than 140 mg/dL      Peak postprandial:   less than 180 mg/dL (1-2 hours)      Critically ill patients:  140 - 180 mg/dL   Results for Holly Reyes, Holly Reyes (MRN [id number]) as of 11/10/2019 08:26  Ref. Range 11/21/2019 16:05 11/06/2019 16:08 10/31/2019 16:24 10/25/2019 17:12 11/17/2019 18:48 11/05/2019 19:29 11/11/2019 23:29 11/10/2019 05:53  Glucose-Capillary Latest Ref Range: 70 - 99 mg/dL 33 (LL)  50 ml D50% 35 (LL) 174 (H) 303 (H) 261 (H) 239 (H) 224 (H)  5 units NOVOLOG  203 (H)   Results for Holly Reyes, Holly Reyes (MRN [id number]) as of 11/10/2019 08:26  Ref. Range 11/03/2019 17:40  Hemoglobin A1C Latest Ref Range: 4.8 - 5.6 % 6.8 (H)    Admit with: Sepsis from Healthcare associated pneumonia  History: DM, Metastatic lung cancer  Home DM Meds: Lantus 25 units QHS       Humalog 7 units TID  Current Orders: Novolog Moderate Correction Scale/ SSI (0-15 units) TID AC + HS    Prednisone 20 mg daily   MD- Note patient with Hypoglycemia on arrival to ED yesterday PM.  CBGs now >200  Please consider the following:  1. Start Lantus 12 units Daily (50% home dose)  2. Increase frequency of Novolog SSI to Q4 hours    --Will follow patient during hospitalization--  Wyn Quaker RN, MSN, CDE Diabetes Coordinator Inpatient Glycemic Control Team Team Pager: [phone] (8a-5p)

## 2019-11-10 NOTE — ED Notes (Signed)
Messaged admitting provider for dietary order - pt is requesting breakfast

## 2019-11-10 NOTE — Progress Notes (Signed)
PROGRESS NOTE    Patient: Holly Reyes                            PCP: Philmore Pali, NP                    DOB: April 01, 1945            DOA: 11/20/2019 KPT:[id number]             DOS: 11/10/2019, 11:13 AM   LOS: 1 day   Date of Service: The patient was seen and examined on 11/10/2019  Subjective:   The patient was seen and examined this Am. Hemodynamically stable, but complaining of generalized weaknesses, lower extremity edema. Improved shortness of breath denies any chest pain.  Daughter present at bedside   Brief Narrative:   Per HPI:  Holly Reyes is a 74 y.o. female with medical history significant for newly diagnosed metastatic adenocarcinoma of the lung, recent hospitalization at [facility] for postobstructive pneumonia who was sent to the ER from the  SNF again for where she stayed for worsening shortness of breath from her baseline.  Patient has been on 3 L--was satting 80%, O2 demand increased to 6 L via nasal cannula, was found lethargic,  with significant swelling to lower extremities, elevated blood pressure.  ED: She had a T-max of 100.5 F upon arrival to the ER, she was tachypneic and tachycardic.  Lactic acid is 0.5. Labs reveal sodium of 131, potassium of 3.8, chloride of 89, bicarb of 30, serum glucose of 56, BUN of 23, creatinine of 0.19, BNP of 31.3, troponin of 32, lactic acid of 0.5, white count of 11.9 with a left shift, hemoglobin of 10.   CT angiogram of the chest which showed moderate free-flowing pleural effusions bilaterally. Consolidation left lower lobe likely due to pneumonia. There is compressive atelectasis in both lower lobes.  Multiple nodular lesions throughout the lungs consistent with metastatic foci. Several of these nodular opacities in the upper lobes show evidence of cavitation. Multiple foci of adenopathy. Enlargement of right adrenal with questionable adrenal masses on the right. Suspect metastatic involvement of the right adrenal.Fibrosis in the upper  lobes, more severe on the right than the left. Chest x-ray reviewed by me shows bilateral pleural effusion and infiltrates. Twelve-lead EKG reviewed by me shows sinus tachycardia with PVCs.  On 6 L of oxygen to maintain pulse oximetry greater than 92%.  She will be admitted to the hospital for further evaluation.  Assessment & Plan:   Principal Problem:   HCAP (healthcare-associated pneumonia) Active Problems:   Essential hypertension   Nicotine dependence   Adenocarcinoma of lung, stage 4 ([place])   Sepsis ([place])   Sepsis from Healthcare associated pneumonia -Hemodynamically stable now -On arrival met sepsis criteria with T-max of 100.40F, tachycardia and tachypnea, mildly elevated white cell count of 11,000 with a left shift and left lower lobe pneumonia.  Lactic acid 0.5, procalcitonin 0.17, Probably postobstructive pneumonia  Imaging suggestive of a left lower lobe consolidation Patient was recently treated at [facility] for same -We'll continue current antibiotic of azithromycin and cefepime, -We'll follow with cultures -Status post IV fluid resuscitation  Ethics -DNR/DNI Once again was discussed with the patient and her daughter regarding her recurrent pneumonia, the static lung cancer therefore leading to poor prognosis, agreed to palliative care consultation which has been placed    Acute on chronic respiratory failure-worsening with recent  hospitalization -It appears patient was discharged from [facility] 2 weeks ago on 3 L of oxygen, now requiring 6 L, -Recurrent pneumonia, likely postobstructive -Worsening respiratory failure secondary to recurrent pneumonia, mets from lung CA -Poor prognosis due to above -We'll maintain O2 sat greater than 90%-with supplement O2 by nasal cannula     Essential hypertension Uncontrolled secondary to pain -Blood pressure has improved this a.m., resuming home medication -As needed labetalol and hydralazine   Nicotine  dependence Continue nicotine transdermal patch 21 mg daily    Metastatic lung cancer Patient was recently diagnosed with adenocarcinoma of the lungs with mets to liver, spleen, adrenal gland and T9 vertebral body.   She also has malignant pleural effusions -She has not started treatment yet -Pending palliative care consult    Diabetes mellitus Patient with blood sugar in the 50s on her chemistry -Diabetic diet, checking CBG QA CHS with SSI coverage -But holding home regimen   Hyperlipidemia Continue statin  Hypokalemia Monitoring repleting p.o. KCl  Depression -Continue Lexapro   Nutritional status:         Cultures; Blood Cultures x 2 >> NGT    Antimicrobials:     Consultants: None   ------------------------------------------------------------------------------------------------------------------------------------------------  DVT prophylaxis:  SCD/Compression stockings and Lovenox SQ Code Status:   Code Status: DNR Family Communication: Daughter present at bedside-updated The above findings and plan of care has been discussed with patient (and family )  in detail,  they expressed understanding and agreement of above. -Advance care planning has been discussed.   Admission status:    Status is: Inpatient  Remains inpatient appropriate because:Inpatient level of care appropriate due to severity of illness   Dispo: The patient is from: SNF              Anticipated d/c is to: SNF              Anticipated d/c date is: 3 days              Patient currently is not medically stable to d/c.        Procedures:   No admission procedures for hospital encounter.     Antimicrobials:  Anti-infectives (From admission, onward)   Start     Dose/Rate Route Frequency Ordered Stop   11/20/2019 2000  ceFEPIme (MAXIPIME) 2 g in sodium chloride 0.9 % 100 mL IVPB        2 g 200 mL/hr over 30 Minutes Intravenous Every 8 hours 10/25/2019 1541      11/21/2019 1800  azithromycin (ZITHROMAX) 500 mg in sodium chloride 0.9 % 250 mL IVPB        500 mg 250 mL/hr over 60 Minutes Intravenous Every 24 hours 11/15/2019 1535 11/14/19 1759   10/23/2019 1115  ceFEPIme (MAXIPIME) 2 g in sodium chloride 0.9 % 100 mL IVPB        2 g 200 mL/hr over 30 Minutes Intravenous  Once 11/17/2019 1101 11/03/2019 1151   10/24/2019 1115  vancomycin (VANCOREADY) IVPB 2000 mg/400 mL        2,000 mg 200 mL/hr over 120 Minutes Intravenous  Once 10/31/2019 1103 11/04/2019 1402       Medication:  . ascorbic acid  500 mg Oral Daily  . aspirin EC  81 mg Oral Daily  . clonazePAM  0.5 mg Oral BID  . clopidogrel  75 mg Oral Daily  . enoxaparin (LOVENOX) injection  40 mg Subcutaneous Q24H  . escitalopram  10 mg Oral Daily  . fluticasone  2 spray Each Nare Daily  . insulin aspart  0-15 Units Subcutaneous TID AC & HS  . lidocaine  1 patch Transdermal Q24H  . losartan  100 mg Oral Daily  . melatonin  5 mg Oral QHS  . multivitamin with minerals  1 tablet Oral Daily  . nicotine  14 mg Transdermal Daily  . polyethylene glycol  17 g Oral Daily  . potassium chloride  40 mEq Oral Once  . predniSONE  20 mg Oral Daily  . rosuvastatin  10 mg Oral Daily  . sodium chloride flush  3 mL Intravenous Q12H  . vitamin B-12  1,000 mcg Oral Daily    sodium chloride, acetaminophen, albuterol, morphine injection, senna, sodium chloride flush   Objective:   Vitals:   11/10/19 0215 11/10/19 0300 11/10/19 0330 11/10/19 0714  BP:      Pulse: 73 76 77   Resp: 17 (!) 26 19   Temp:    99.3 F (37.4 C)  TempSrc:    Oral  SpO2: 99% 99% 99%   Weight:        Intake/Output Summary (Last 24 hours) at 11/10/2019 1113 Last data filed at 11/10/2019 0548 Gross per 24 hour  Intake --  Output 2250 ml  Net -2250 ml   Filed Weights   10/24/2019 0833  Weight: 104.3 kg     Examination:   Physical Exam  Constitution:  Alert, cooperative, no distress,  Appears calm and comfortable  Psychiatric:  Normal and stable mood and affect, cognition intact,   HEENT: Normocephalic, PERRL, otherwise with in Normal limits  Chest:Chest symmetric Cardio vascular:  S1/S2, RRR, No murmure, No Rubs or Gallops  pulmonary: Clear to auscultation bilaterally, respirations unlabored, negative wheezes / crackles Abdomen: Soft, non-tender, non-distended, bowel sounds,no masses, no organomegaly Muscular skeletal: Limited exam - in bed, able to move all 4 extremities, severe generalized weaknesses Neuro: CNII-XII intact. , normal motor and sensation, reflexes intact  Extremities: +4 pitting edema lower extremities, +2 pulses  Skin: Dry, warm to touch, negative for any Rashes, No open wounds Wounds: per nursing documentation    ------------------------------------------------------------------------------------------------------------------------------------------    LABs:  CBC Latest Ref Rng & Units 11/10/2019 10/31/2019  WBC 4.0 - 10.5 K/uL 8.9 11.9(H)  Hemoglobin 12.0 - 15.0 g/dL 8.7(L) 10.8(L)  Hematocrit 36 - 46 % 28.0(L) 35.0(L)  Platelets 150 - 400 K/uL 82(L) 100(L)   CMP Latest Ref Rng & Units 11/10/2019 11/10/2019 02/12/2018  Glucose 70 - 99 mg/dL 306(H) 56(L) -  BUN 8 - 23 mg/dL '23 23 17  ' Creatinine 0.44 - 1.00 mg/dL 0.90 0.92 1.24(H)  Sodium 135 - 145 mmol/L 132(L) 131(L) -  Potassium 3.5 - 5.1 mmol/L 4.2 3.8 -  Chloride 98 - 111 mmol/L 94(L) 89(L) -  CO2 22 - 32 mmol/L 27 30 -  Calcium 8.9 - 10.3 mg/dL 8.1(L) 8.9 -  Total Protein 6.5 - 8.1 g/dL - 5.9(L) -  Total Bilirubin 0.3 - 1.2 mg/dL - 1.0 -  Alkaline Phos 38 - 126 U/L - 73 -  AST 15 - 41 U/L - 24 -  ALT 0 - 44 U/L - 25 -       Micro Results Recent Results (from the past 240 hour(s))  Blood culture (routine x 2)     Status: None (Preliminary result)   Collection Time: 11/20/2019  8:35 AM   Specimen: BLOOD  Result Value Ref Range Status   Specimen Description BLOOD LEFT [facility]  Final   Special Requests  Final    BOTTLES DRAWN  AEROBIC AND ANAEROBIC Blood Culture results may not be optimal due to an excessive volume of blood received in culture bottles   Culture   Final    NO GROWTH < 24 HOURS Performed at [facility], [address] [place], [place] [address]    Report Status PENDING  Incomplete  SARS Coronavirus 2 by RT PCR (hospital order, performed in [facility] hospital lab) Nasopharyngeal Nasopharyngeal Swab     Status: None   Collection Time: 11/04/2019  8:38 AM   Specimen: Nasopharyngeal Swab  Result Value Ref Range Status   SARS Coronavirus 2 NEGATIVE NEGATIVE Final    Comment: (NOTE) SARS-CoV-2 target nucleic acids are NOT DETECTED.  The SARS-CoV-2 RNA is generally detectable in upper and lower respiratory specimens during the acute phase of infection. The lowest concentration of SARS-CoV-2 viral copies this assay can detect is 250 copies / mL. A negative result does not preclude SARS-CoV-2 infection and should not be used as the sole basis for treatment or other patient management decisions.  A negative result may occur with improper specimen collection / handling, submission of specimen other than nasopharyngeal swab, presence of viral mutation(s) within the areas targeted by this assay, and inadequate number of viral copies (<250 copies / mL). A negative result must be combined with clinical observations, patient history, and epidemiological information.  Fact Sheet for Patients:   StrictlyIdeas.no  Fact Sheet for Healthcare Providers: BankingDealers.co.za  This test is not yet approved or  cleared by the [place] FDA and has been authorized for detection and/or diagnosis of SARS-CoV-2 by FDA under an Emergency Use Authorization (EUA).  This EUA will remain in effect (meaning this test can be used) for the duration of the COVID-19 declaration under Section 564(b)(1) of the Act, 21 U.S.C. section 360bbb-3(b)(1), unless the  authorization is terminated or revoked sooner.  Performed at [facility], [address] [place], [place] [address]   Blood culture (routine x 2)     Status: None (Preliminary result)   Collection Time: 11/16/2019  8:42 AM   Specimen: BLOOD LEFT HAND  Result Value Ref Range Status   Specimen Description BLOOD LEFT HAND  Final   Special Requests   Final    BOTTLES DRAWN AEROBIC AND ANAEROBIC Blood Culture adequate volume   Culture   Final    NO GROWTH < 24 HOURS Performed at [facility], [address] [place], [place] [address]    Report Status PENDING  Incomplete    Radiology Reports CT Angio Chest PE W and/or Wo Contrast  Result Date: 11/07/2019 CLINICAL DATA:  Shortness of breath.  History of lung carcinoma EXAM: CT ANGIOGRAPHY CHEST WITH CONTRAST TECHNIQUE: Multidetector CT imaging of the chest was performed using the standard protocol during bolus administration of intravenous contrast. Multiplanar CT image reconstructions and MIPs were obtained to evaluate the vascular anatomy. CONTRAST:  36m OMNIPAQUE IOHEXOL 350 MG/ML SOLN COMPARISON:  Chest radiograph November 09, 2019 FINDINGS: Cardiovascular: There is no demonstrable pulmonary embolus. There is no thoracic aortic aneurysm or dissection. There are scattered foci of calcification in visualized great vessels. There are foci of aortic atherosclerosis. There are foci of coronary artery calcification. No pericardial effusion or pericardial thickening. Mediastinum/Nodes: No thyroid lesions evident. There is adenopathy in the aortopulmonary window region with the largest individual lymph node in this area measuring 1.7 x 1.4 cm. A confluence of lymph nodes in the superior left hilar region is  noted measuring 2.3 x 1.6 cm. There is a lymph node anterior to the carina measuring 1.8 x 1.8 cm. There is a lymph node in the superior right hilum measuring 2.2 x 1.9 cm. Subcarinal lymph nodes are noted, largest measuring 1.9 x  1.4 cm. A lymph node anterior to the lower trachea measures 1.3 x 1.2 cm. No esophageal lesions are appreciable. Lungs/Pleura: There are free-flowing pleural effusions bilaterally. There is airspace consolidation consistent with pneumonia in the left lower lobe. There is also a degree of compressive atelectasis in each lower lobe. There are nodular opacities throughout the lungs bilaterally consistent with widespread metastatic disease. Several of these nodular opacities in the upper lobes show evidence of cavitation. Nodular opacities range in size from as small as 3 mm to as large as 1.5 x 1.5 cm. In the apices, there is fibrosis which is more severe on the right than on the left. Upper Abdomen: There is diffuse enlargement of the right adrenal with suspected masses within the right adrenal. There is upper abdominal aortic atherosclerosis. Musculoskeletal: There is degenerative change in the thoracic spine. No blastic or lytic bone lesions are evident. No chest wall lesions are appreciable. Review of the MIP images confirms the above findings. IMPRESSION: 1. No demonstrable pulmonary embolus. No thoracic aortic aneurysm or dissection. There is aortic atherosclerosis as well as foci of coronary artery calcification. 2. Moderate free-flowing pleural effusions bilaterally. Consolidation left lower lobe likely due to pneumonia. There is compressive atelectasis in both lower lobes. 3. Multiple nodular lesions throughout the lungs consistent with metastatic foci. Several of these nodular opacities in the upper lobes show evidence of cavitation. 4.  Multiple foci of adenopathy. 5. Enlargement of right adrenal with questionable adrenal masses on the right. Suspect metastatic involvement of the right adrenal. 6. Fibrosis in the upper lobes, more severe on the right than the left. Aortic Atherosclerosis (ICD10-I70.0). Electronically Signed   By: Lowella Grip III M.D.   On: 11/18/2019 10:46   DG Chest Portable 1  View  Result Date: 10/27/2019 CLINICAL DATA:  Lung cancer, CHF, COPD, shortness of breath, hypoxia, new lung cancer diagnosis EXAM: PORTABLE CHEST 1 VIEW COMPARISON:  Portable exam 0905 hours compared to 12/15/2006 FINDINGS: Normal heart size and mediastinal contours. Enlargement of BILATERAL pulmonary hila question adenopathy. Patchy infiltrates are seen in both lungs which could represent pneumonia or edema. Multiple nodular appearing foci in both lungs concerning for pulmonary metastases. Small bibasilar effusions and atelectasis. No pneumothorax. Osseous structures unremarkable. IMPRESSION: BILATERAL pulmonary infiltrates and pleural effusions. Nodular foci in both lungs suspicious for pulmonary metastases in patient with reported new diagnosis of lung cancer. Suspected hilar adenopathy bilaterally. Correlation with CT chest with contrast recommended, if not previously performed elsewhere. Electronically Signed   By: Lavonia Dana M.D.   On: 10/28/2019 09:17    SIGNED: Deatra James, MD, FACP, FHM. [facility],  Pager (please use [email or web address] to page/text)  If 7PM-7AM, please contact night-coverage [email or web address], Password [facility] 11/10/2019, 11:13 AM
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
## 2019-11-11 DIAGNOSIS — Z66 Do not resuscitate: Secondary | ICD-10-CM

## 2019-11-11 DIAGNOSIS — J918 Pleural effusion in other conditions classified elsewhere: Secondary | ICD-10-CM

## 2019-11-11 DIAGNOSIS — R5081 Fever presenting with conditions classified elsewhere: Secondary | ICD-10-CM

## 2019-11-11 DIAGNOSIS — R4182 Altered mental status, unspecified: Secondary | ICD-10-CM

## 2019-11-11 DIAGNOSIS — Z7189 Other specified counseling: Secondary | ICD-10-CM

## 2019-11-11 LAB — CBC
HCT: 27.5 % — ABNORMAL LOW (ref 36.0–46.0)
Hemoglobin: 8.9 g/dL — ABNORMAL LOW (ref 12.0–15.0)
MCH: 27.9 pg (ref 26.0–34.0)
MCHC: 32.4 g/dL (ref 30.0–36.0)
MCV: 86.2 fL (ref 80.0–100.0)
Platelets: 93 10*3/uL — ABNORMAL LOW (ref 150–400)
RBC: 3.19 MIL/uL — ABNORMAL LOW (ref 3.87–5.11)
RDW: 16.2 % — ABNORMAL HIGH (ref 11.5–15.5)
WBC: 7.6 10*3/uL (ref 4.0–10.5)
nRBC: 0 % (ref 0.0–0.2)

## 2019-11-11 LAB — BASIC METABOLIC PANEL
Anion gap: 8 (ref 5–15)
BUN: 20 mg/dL (ref 8–23)
CO2: 30 mmol/L (ref 22–32)
Calcium: 8.2 mg/dL — ABNORMAL LOW (ref 8.9–10.3)
Chloride: 98 mmol/L (ref 98–111)
Creatinine, Ser: 0.9 mg/dL (ref 0.44–1.00)
GFR calc Af Amer: >60 mL/min (ref >60)
GFR calc non Af Amer: >60 mL/min (ref >60)
Glucose, Bld: 213 mg/dL — ABNORMAL HIGH (ref 70–99)
Potassium: 4.9 mmol/L (ref 3.5–5.1)
Sodium: 136 mmol/L (ref 135–145)

## 2019-11-11 LAB — GLUCOSE, CAPILLARY
Glucose-Capillary: 185 mg/dL — ABNORMAL HIGH (ref 70–99)
Glucose-Capillary: 169 mg/dL — ABNORMAL HIGH (ref 70–99)

## 2019-11-11 LAB — CALCIUM, IONIZED: Calcium, Ionized, Serum: 4.9 mg/dL (ref 4.5–5.6)

## 2019-11-11 MED ORDER — ALBUMIN HUMAN 5 % IV SOLN
25.0000 g | Freq: Three times a day (TID) | INTRAVENOUS | Status: DC
  Administered 2019-11-11: 25 g via INTRAVENOUS
  Filled 2019-11-11 (×2): qty 500

## 2019-11-11 MED ORDER — FLUTICASONE PROPIONATE 50 MCG/ACT NA SUSP
2.0000 | Freq: Every day | NASAL | Status: DC | PRN
  Filled 2019-11-11: qty 16

## 2019-11-11 MED ORDER — POLYETHYLENE GLYCOL 3350 17 G PO PACK: 17.0000 g | PACK | Freq: Every day | ORAL | Status: DC | PRN

## 2019-11-11 MED ORDER — BIOTENE DRY MOUTH MT LIQD: 15.0000 mL | OROMUCOSAL | Status: DC | PRN

## 2019-11-11 MED ORDER — AZITHROMYCIN 250 MG PO TABS: 500.0000 mg | ORAL_TABLET | Freq: Every day | ORAL | Status: DC

## 2019-11-11 MED ORDER — FUROSEMIDE 10 MG/ML IJ SOLN
40.0000 mg | Freq: Two times a day (BID) | INTRAMUSCULAR | Status: DC
  Administered 2019-11-11: 40 mg via INTRAVENOUS
  Filled 2019-11-11: qty 4

## 2019-11-11 MED ORDER — MORPHINE SULFATE (CONCENTRATE) 10 MG/0.5ML PO SOLN
5.0000 mg | ORAL | Status: DC | PRN
  Administered 2019-11-12 (×3): 5 mg via SUBLINGUAL
  Filled 2019-11-11 (×2): qty 0.5

## 2019-11-11 MED ORDER — FUROSEMIDE 10 MG/ML IJ SOLN
40.0000 mg | Freq: Every day | INTRAMUSCULAR | Status: DC
  Administered 2019-11-12: 40 mg via INTRAVENOUS
  Filled 2019-11-11: qty 4

## 2019-11-11 MED ORDER — LORAZEPAM 2 MG/ML IJ SOLN
0.5000 mg | INTRAMUSCULAR | Status: DC | PRN
  Administered 2019-11-12 – 2019-11-13 (×3): 0.5 mg via INTRAVENOUS
  Filled 2019-11-11 (×3): qty 1

## 2019-11-11 MED ORDER — ONDANSETRON HCL 4 MG/2ML IJ SOLN: 4.0000 mg | Freq: Four times a day (QID) | INTRAMUSCULAR | Status: DC | PRN

## 2019-11-11 MED ORDER — OXYCODONE HCL 5 MG PO TABS
5.0000 mg | ORAL_TABLET | Freq: Four times a day (QID) | ORAL | Status: DC | PRN
  Administered 2019-11-11 (×2): 5 mg via ORAL
  Filled 2019-11-11 (×2): qty 1

## 2019-11-11 MED ORDER — MORPHINE SULFATE (CONCENTRATE) 10 MG/0.5ML PO SOLN
5.0000 mg | ORAL | Status: DC | PRN
  Filled 2019-11-11: qty 0.5

## 2019-11-11 MED ORDER — MORPHINE SULFATE (PF) 2 MG/ML IV SOLN
2.0000 mg | INTRAVENOUS | Status: DC | PRN
  Administered 2019-11-12 (×3): 2 mg via INTRAVENOUS
  Filled 2019-11-11 (×3): qty 1

## 2019-11-11 MED ORDER — GLYCOPYRROLATE 0.2 MG/ML IJ SOLN
0.3000 mg | INTRAMUSCULAR | Status: DC | PRN
  Filled 2019-11-11 (×2): qty 1.5

## 2019-11-11 MED ORDER — ACETAMINOPHEN 500 MG PO TABS: 1000.0000 mg | ORAL_TABLET | Freq: Three times a day (TID) | ORAL | Status: DC | PRN

## 2019-11-11 MED ORDER — POLYVINYL ALCOHOL 1.4 % OP SOLN
1.0000 [drp] | Freq: Four times a day (QID) | OPHTHALMIC | Status: DC | PRN
  Filled 2019-11-11: qty 15

## 2019-11-11 MED ORDER — ONDANSETRON 4 MG PO TBDP
4.0000 mg | ORAL_TABLET | Freq: Four times a day (QID) | ORAL | Status: DC | PRN
  Filled 2019-11-11: qty 1

## 2019-11-11 NOTE — Progress Notes (Signed)
Pharmacy Antibiotic Note  Holly Reyes is a 74 y.o. female admitted on 10/30/2019 with pneumonia.  Pharmacy has been consulted for cefepime dosing. Pt also on azithromycin.   Plan: Day 3 of abx. Cefepime 2 g IV q8h   Weight: 104.3 kg (230 lb)  Temp (24hrs), Avg:98.7 F (37.1 C), Min:98.4 F (36.9 C), Max:99.5 F (37.5 C)  Recent Labs  Lab 11/04/2019 0838 11/10/19 0743 11/10/19 0800 11/10/19 1313 11/10/19 1600 11/11/19 0453  WBC 11.9*  --  8.9  --   --  7.6  CREATININE 0.92 0.90  --   --   --  0.90  LATICACIDVEN 0.5  --   --  1.7 1.4  --     Estimated Creatinine Clearance: 71.5 mL/min (by C-G formula based on SCr of 0.9 mg/dL).    Allergies  Allergen Reactions  . Penicillins Rash    Has patient had a PCN reaction causing immediate rash, facial/tongue/throat swelling, SOB or lightheadedness with hypotension: No Has patient had a PCN reaction causing severe rash involving mucus membranes or skin necrosis: No Has patient had a PCN reaction that required hospitalization: No Has patient had a PCN reaction occurring within the last 10 years: No If all of the above answers are "NO", then may proceed with Cephalosporin use.     Antimicrobials this admission:   Dose adjustments this admission:   Microbiology results: 8/18 BCx: collected 8/18 Sputum: ordered   Thank you for allowing pharmacy to be a part of this patient's care.  Oswald Hillock 11/11/2019 11:56 AM

## 2019-11-11 NOTE — TOC Initial Note (Signed)
Transition of Care [facility]) - Initial/Assessment Note    Patient Details  Name: Holly Reyes MRN: [id number] Date of Birth: 1946-03-13  Transition of [facility]) CM/SW Contact:    Victorino Dike, RN Phone Number: 11/11/2019, 10:17 AM  Clinical Narrative:                   Spoke with patient and daughter today.  Patient reports living at home.  States she has never needed DME in home, this is the first time she has needed oxygen. Daughter helps her is she has needs, but daughter has a full time job.    Reported patient and daughter to meet with Nurse Practitioner today to discuss need for hospice home.   Will continue to monitor.  Expected Discharge Plan: [place] Barriers to Discharge: Continued Medical Work up   Patient Goals and CMS Choice     Choice offered to / list presented to : Patient, Adult Children  Expected Discharge Plan and Services Expected Discharge Plan: [place]       Living arrangements for the past 2 months: Apartment                                      Prior Living Arrangements/Services Living arrangements for the past 2 months: Apartment Lives with:: Self Patient language and need for interpreter reviewed:: Yes Do you feel safe going back to the place where you live?: Yes      Need for Family Participation in Patient Care: No (Comment) Care giver support system in place?: No (comment)   Criminal Activity/Legal Involvement Pertinent to Current Situation/Hospitalization: No - Comment as needed  Activities of Daily Living Home Assistive Devices/Equipment: Oxygen ADL Screening (condition at time of admission) Patient's cognitive ability adequate to safely complete daily activities?: Yes Is the patient deaf or have difficulty hearing?: No Does the patient have difficulty seeing, even when wearing glasses/contacts?: No Does the patient have difficulty concentrating, remembering, or making decisions?:  No Patient able to express need for assistance with ADLs?: Yes Does the patient have difficulty dressing or bathing?: No Independently performs ADLs?: Yes (appropriate for developmental age) Does the patient have difficulty walking or climbing stairs?: No Weakness of Legs: None Weakness of Arms/Hands: None  Permission Sought/Granted                  Emotional Assessment Appearance:: Appears stated age Attitude/Demeanor/Rapport: Engaged Affect (typically observed): Appropriate Orientation: : Oriented to Self, Oriented to Place, Oriented to  Time, Oriented to Situation Alcohol / Substance Use: Not Applicable Psych Involvement: No (comment)  Admission diagnosis:  Parapneumonic effusion [J18.9, J91.8] SOB (shortness of breath) [R06.02] Hypoxia [R09.02] HCAP (healthcare-associated pneumonia) [J18.9] Altered mental status, unspecified altered mental status type [R41.82] Fever in other diseases [R50.81] Patient Active Problem List   Diagnosis Date Noted  . HCAP (healthcare-associated pneumonia) 10/24/2019  . Nicotine dependence 11/15/2019  . Metastatic adenocarcinoma ([place]) 11/02/2019  . Adenocarcinoma of lung, stage 4 ([place]) 11/03/2019  . Sepsis ([place]) 11/11/2019  . Atherosclerosis of native arteries of extremity with intermittent claudication ([place]) 02/01/2018  . DJD (degenerative joint disease) 02/01/2018  . Bilateral lower extremity edema 12/24/2017  . Lower extremity pain, bilateral 12/24/2017  . Cellulitis of left lower extremity 12/24/2017  . Hyperlipidemia 12/24/2017  . Essential hypertension 12/24/2017  . Colon polyps 06/03/2016   PCP:  Chauncy Passy,  Rudi Rummage, NP Pharmacy:   [id number] - [place] [place] [address] Phone: [phone] Fax: [phone]  [id number] [place], [place] - [address] [place] [address] [place] [address] Phone: [phone] Fax: [phone]     Social  Determinants of Health (SDOH) Interventions    Readmission Risk Interventions No flowsheet data found.

## 2019-11-11 NOTE — Progress Notes (Signed)
Daily Progress Note   Patient Name: Holly Reyes       Date: 11/11/2019 DOB: 27-Aug-1945  Age: 74 y.o. MRN#: [id number] Attending Physician: Deatra James, MD Primary Care Physician: Holly Pali, NP Admit Date: 11/14/2019  Reason for Consultation/Follow-up: Establishing goals of care  Subjective: Patient is awake, alert and oriented x3.  Denies pain.  Complains of shortness of breath with minimal exertion.  Patient shares that she had an eventful morning, around 3 am with severe respiratory distress, similar to yesterday. She reports she is unable to do much of anything without feeling as though she is suffocating and unable to take a deep breath. Reports medications provided during episode eases discomfort and distress.   Her daughter, Holly Reyes is at the bedside for support and continued goals of care discussion.   Reviewed discussions with patient from yesterday.  Detailed updates provided.  Patient verbalizes clear understanding with realistic goals in regards to her metastatic cancer, decrease in functional state, and quality of life.  She is tearful expressing she has lived a good life and God has allowed her the time to see her daughter and grandkids grow up.  Patient shares although she is not ready to leave her family she knows her body is growing tired and cannot continue to take much more.  Daughter at the bedside tearful and providing support to her mother in addition to patient's niece via FaceTime.  Space created for family to communicate with each other regarding feelings and their wishes.  She reports she feels as if she is suffering especially over the past several weeks with increased respiratory distress.  She shares she does not like that feeling and knows that it is coming from recurrent pneumonia and her lung cancer.  Patient confirms she is not interested in chemotherapy.  [name] and her daughter confirms their wishes for patient to be comfortable and spend what time  she has left amongst her family and friends.  Patient shares previous experiences of caring for her mother and father with the support of hospice.  She reports she would love to be at home with hospice support however she knows her care needs are not something that can be managed in the home her daughter alone.  Support given and therapeutic listening provided  As requested education provided regarding hospice and comfort care.  Education provided with understanding patient would no longer receive aggressive medical interventions such as continuous vital signs, lab work, radiology testing, or medications not focused on comfort if her wishes were to proceed with comfort care with no aggressive interventions.  Patient and daughter verbalized understanding with awareness all care would focus on how the patient is looking and feeling. This would include management of any symptoms that may cause discomfort, pain, shortness of breath, cough, nausea, agitation, anxiety, and/or secretions etc. Symptoms would be managed with medications and other non-pharmacological interventions such as spiritual support if requested, repositioning, music therapy, or therapeutic listening. Family and patient verbalized understanding and appreciation.   [name] and her daughter confirms wishes for comfort care and residential hospice placement.  Family resides in [place] and is requesting consideration for [employer] [facility] location.  Length of Stay: 2 days  Vital Signs: BP (!) 183/73 (BP Location: Right Arm)   Pulse 90   Temp 98.5 F (36.9 C) (Oral)   Resp 18   Wt 104.3 kg   SpO2 98%   BMI 33.97 kg/m  SpO2: SpO2: 98 % O2  Device: O2 Device: Nasal Cannula O2 Flow Rate: O2 Flow Rate (L/min): 4 L/min  Physical Exam: -A and O x3, chronically ill-appearing -RRR -Bilateral crackles, some use of accessory muscles, dyspneic on minimal exertion -Bilateral lower extremity edema left greater than right -Mood  appropriate, follows commands  Palliative Care Assessment & Plan    Code Status: DNR  Goals of Care/Recommendations: DNR/DNI-confirmed by patient and daughter Transition care to a more comfort focus with a goal of symptom management to relieve suffering. Patient verbalized understanding of poor prognosis and wishes for comfort with what time she has left with family.  Patient and daughter requesting residential hospice placement for comfort/EOL continue care.  Family is requesting preferred location of [place].  (TOC referral placed for residential hospice) Morphine PRN for pain/air hunger/comfort Robinul PRN for excessive secretions Ativan PRN for agitation/anxiety Zofran PRN for nausea Liquifilm tears PRN for dry eyes May have comfort feeding/no diet restrictions Comfort cart for family Unrestricted visitations in the setting of EOL (per policy) Oxygen PRN 2L or less for comfort. No escalation.   Prognosis: Poor  Discharge Planning: Hospice facility  Thank you for allowing the Palliative Medicine Team to assist in the care of this patient.  Time Total:65 min.   Visit consisted of counseling and education dealing with the complex and emotionally intense issues of symptom management and palliative care in the setting of serious and potentially life-threatening illness.Greater than 50%  of this time was spent counseling and coordinating care related to the above assessment and plan.  Alda Lea, AGPCNP-BC  Palliative Medicine Team [phone]

## 2019-11-11 NOTE — Progress Notes (Signed)
PROGRESS NOTE    Patient: Holly Reyes                            PCP: Philmore Pali, NP                    DOB: December 23, 1945            DOA: 11/08/2019 BMW:[id number]             DOS: 11/11/2019, 10:48 AM   LOS: 2 days   Date of Service: The patient was seen and examined on 11/11/2019  Subjective:   The patient was seen and examined this morning, still complaining shortness of breath, but much improved from yesterday, complaining of lower extremity edema.  Denies any chest pain  Overnight patient was complaining of shortness of breath, IV fluid was DC'd, one-time Lasix was given   Brief Narrative:   Per HPI:  Holly Reyes is a 74 y.o. female with medical history significant for newly diagnosed metastatic adenocarcinoma of the lung, recent hospitalization at [facility] for postobstructive pneumonia who was sent to the ER from the  SNF again for where she stayed for worsening shortness of breath from her baseline.  Patient has been on 3 L--was satting 80%, O2 demand increased to 6 L via nasal cannula, was found lethargic,  with significant swelling to lower extremities, elevated blood pressure.  ED: She had a T-max of 100.5 F upon arrival to the ER, she was tachypneic and tachycardic.  Lactic acid is 0.5. Labs reveal sodium of 131, potassium of 3.8, chloride of 89, bicarb of 30, serum glucose of 56, BUN of 23, creatinine of 0.19, BNP of 31.3, troponin of 32, lactic acid of 0.5, white count of 11.9 with a left shift, hemoglobin of 10.   CT angiogram of the chest which showed moderate free-flowing pleural effusions bilaterally. Consolidation left lower lobe likely due to pneumonia. There is compressive atelectasis in both lower lobes.  Multiple nodular lesions throughout the lungs consistent with metastatic foci. Several of these nodular opacities in the upper lobes show evidence of cavitation. Multiple foci of adenopathy. Enlargement of right adrenal with questionable adrenal masses on the right.  Suspect metastatic involvement of the right adrenal.Fibrosis in the upper lobes, more severe on the right than the left. Chest x-ray reviewed by me shows bilateral pleural effusion and infiltrates. Twelve-lead EKG reviewed by me shows sinus tachycardia with PVCs.  On 6 L of oxygen to maintain pulse oximetry greater than 92%.  She will be admitted to the hospital for further evaluation.  Assessment & Plan:   Principal Problem:   HCAP (healthcare-associated pneumonia) Active Problems:   Essential hypertension   Nicotine dependence   Adenocarcinoma of lung, stage 4 ([place])   Sepsis ([place])   Sepsis from Healthcare associated pneumonia -Much improved, hemodynamically stable now, afebrile normotensive -Status post IV fluid resuscitation with sepsis protocol, continue antibiotics -DC'd IV fluid due to shortness of breath   -On arrival met sepsis criteria with T-max of 100.17F, tachycardia and tachypnea, mildly elevated white cell count of 11,000 with a left shift and left lower lobe pneumonia.  Lactic acid 0.5, procalcitonin 0.17, Probably postobstructive pneumonia  Imaging suggestive of a left lower lobe consolidation Patient was recently treated at [facility] for same -We'll continue current antibiotic of azithromycin and cefepime, -We'll follow with cultures -Status post IV fluid resuscitation  Ethics -DNR/DNI -Palliative care team meeting  with daughter today     Acute on chronic respiratory failure-worsening with recent hospitalization -It appears patient was discharged from [facility] 2 weeks ago on 3 L of oxygen, now requiring 6 L, -Recurrent pneumonia, likely postobstructive -Worsening respiratory failure secondary to recurrent pneumonia, mets from lung CA -Poor prognosis due to above -We'll maintain O2 sat greater than 90%-with supplement O2 by nasal cannula   -Patient had worsening shortness of breath overnight, along with worsening lower extremity edema due to IV fluid  resuscitation for sepsis -IV fluids were DC'd, initiating IV Lasix, monitoring I's and O's and daily weight Continue DuoNeb, per RT     Essential hypertension Uncontrolled secondary to pain -Blood pressure has improved this a.m., resuming home medication --titrating for better BP control -As needed labetalol and hydralazine   Nicotine dependence Continue nicotine transdermal patch 21 mg daily    Metastatic lung cancer Patient was recently diagnosed with adenocarcinoma of the lungs with mets to liver, spleen, adrenal gland and T9 vertebral body.   She also has malignant pleural effusions -She has not started treatment yet -Pending palliative care consult -meeting with family today    Diabetes mellitus Patient with blood sugar in the 50s on her chemistry -Diabetic diet, checking CBG QA CHS with SSI coverage -But holding home regimen   Hyperlipidemia Continue statin  Hypokalemia Monitoring repleting p.o. KCl  Depression -Continue Lexapro   Nutritional status:         Cultures; Blood Cultures x 2 >> NGT    Antimicrobials: azithromycin and cefepime    Consultants: None   ----------------------------------------------------------------------------------------------------------------------------------------------  DVT prophylaxis:  SCD/Compression stockings and Lovenox SQ Code Status:   Code Status: DNR Family Communication: Daughter present at bedside-updated The above findings and plan of care has been discussed with patient (and family )  in detail,  they expressed understanding and agreement of above. -Advance care planning has been discussed.   Admission status:    Status is: Inpatient  Remains inpatient appropriate because:Inpatient level of care appropriate due to severity of illness   Dispo: The patient is from: SNF              Anticipated d/c is to: SNF              Anticipated d/c date is: 3 days              Patient currently  is not medically stable to d/c.        Procedures:   No admission procedures for hospital encounter.     Antimicrobials:  Anti-infectives (From admission, onward)   Start     Dose/Rate Route Frequency Ordered Stop   11/06/2019 2000  ceFEPIme (MAXIPIME) 2 g in sodium chloride 0.9 % 100 mL IVPB        2 g 200 mL/hr over 30 Minutes Intravenous Every 8 hours 10/31/2019 1541     10/23/2019 1800  azithromycin (ZITHROMAX) 500 mg in sodium chloride 0.9 % 250 mL IVPB        500 mg 250 mL/hr over 60 Minutes Intravenous Every 24 hours 11/15/2019 1535 11/14/19 1759   11/06/2019 1115  ceFEPIme (MAXIPIME) 2 g in sodium chloride 0.9 % 100 mL IVPB        2 g 200 mL/hr over 30 Minutes Intravenous  Once 11/21/2019 1101 11/07/2019 1151   11/11/2019 1115  vancomycin (VANCOREADY) IVPB 2000 mg/400 mL        2,000 mg 200 mL/hr over 120 Minutes Intravenous  Once 11/05/2019  1103 11/21/2019 1402       Medication:  . ascorbic acid  500 mg Oral Daily  . aspirin EC  81 mg Oral Daily  . clonazePAM  0.5 mg Oral BID  . clopidogrel  75 mg Oral Daily  . enoxaparin (LOVENOX) injection  40 mg Subcutaneous Q24H  . escitalopram  10 mg Oral Daily  . fluticasone  2 spray Each Nare Daily  . furosemide  40 mg Intravenous BID  . guaiFENesin-dextromethorphan  10 mL Oral Q8H  . insulin aspart  0-15 Units Subcutaneous TID AC & HS  . lidocaine  1 patch Transdermal Q24H  . losartan  100 mg Oral Daily  . melatonin  5 mg Oral QHS  . multivitamin with minerals  1 tablet Oral Daily  . nicotine  14 mg Transdermal Daily  . polyethylene glycol  17 g Oral Daily  . predniSONE  20 mg Oral Daily  . rosuvastatin  10 mg Oral Daily  . sodium chloride flush  3 mL Intravenous Q12H  . vitamin B-12  1,000 mcg Oral Daily    sodium chloride, acetaminophen, albuterol, morphine injection, oxyCODONE, senna, sodium chloride flush   Objective:   Vitals:   11/10/19 1731 11/10/19 1919 11/11/19 0308 11/11/19 0759  BP: (!) 176/63 (!) 168/72 (!)  165/79 (!) 169/84  Pulse: (!) 104 (!) 107 100 89  Resp:  _0 Temp: 98.7 F (37.1 C) 98.5 F (36.9 C) 98.4 F (36.9 C) 98.4 F (36.9 C)  TempSrc: Oral Oral Oral Oral  SpO2: 93% 95% 96% 97%  Weight:        Intake/Output Summary (Last 24 hours) at 11/11/2019 1048 Last data filed at 11/11/2019 1041 Gross per 24 hour  Intake 850.99 ml  Output 2800 ml  Net -1949.01 ml   Filed Weights   11/03/2019 0833  Weight: 104.3 kg     Examination:      Physical Exam:   General:  Alert, oriented, cooperative, no distress; SOB   HEENT:  Normocephalic, PERRL, otherwise with in Normal limits   Neuro:  CNII-XII intact. , normal motor and sensation, reflexes intact   Lungs:   Clear to auscultation BL, Respirations unlabored, no wheezes /  Noted for scattered rails, mild crackles bilateral lower lobes  Cardio:    S1/S2, RRR, No murmure, No Rubs or Gallops   Abdomen:   Soft, non-tender, bowel sounds active all four quadrants,  no guarding or peritoneal signs.  Muscular skeletal:   Generalized weaknesses bilaterally, Limited exam - in bed, able to move all 4 extremities, Normal strength,  2+ pulses,  symmetric, ++2  pitting edema  Skin:  Dry, warm to touch, negative for any Rashes, No open wounds  Wounds: Please see nursing documentation              ------------------------------------------------------------------------------------------------------------------------------------------    LABs:  CBC Latest Ref Rng & Units 11/11/2019 11/10/2019 11/07/2019  WBC 4.0 - 10.5 K/uL 7.6 8.9 11.9(H)  Hemoglobin 12.0 - 15.0 g/dL 8.9(L) 8.7(L) 10.8(L)  Hematocrit 36 - 46 % 27.5(L) 28.0(L) 35.0(L)  Platelets 150 - 400 K/uL 93(L) 82(L) 100(L)   CMP Latest Ref Rng & Units 11/11/2019 11/10/2019 10/28/2019  Glucose 70 - 99 mg/dL 213(H) 306(H) 56(L)  BUN 8 - 23 mg/dL _1 Creatinine 0.44 - 1.00 mg/dL 0.90 0.90 0.92  Sodium 135 - 145 mmol/L 136 132(L) 131(L)  Potassium 3.5 - 5.1 mmol/L 4.9  4.2 3.8  Chloride 98 - 111 mmol/L  98 94(L) 89(L)  CO2 22 - 32 mmol/L _0 Calcium 8.9 - 10.3 mg/dL 8.2(L) 8.1(L) 8.9  Total Protein 6.5 - 8.1 g/dL - - 5.9(L)  Total Bilirubin 0.3 - 1.2 mg/dL - - 1.0  Alkaline Phos 38 - 126 U/L - - 73  AST 15 - 41 U/L - - 24  ALT 0 - 44 U/L - - 25       Micro Results Recent Results (from the past 240 hour(s))  Blood culture (routine x 2)     Status: None (Preliminary result)   Collection Time: 11/14/2019  8:35 AM   Specimen: BLOOD  Result Value Ref Range Status   Specimen Description BLOOD LEFT AC  Final   Special Requests   Final    BOTTLES DRAWN AEROBIC AND ANAEROBIC Blood Culture results may not be optimal due to an excessive volume of blood received in culture bottles   Culture   Final    NO GROWTH 2 DAYS Performed at [facility], [address], [place], [place] [address]    Report Status PENDING  Incomplete  SARS Coronavirus 2 by RT PCR (hospital order, performed in [facility] hospital lab) Nasopharyngeal Nasopharyngeal Swab     Status: None   Collection Time: 11/10/2019  8:38 AM   Specimen: Nasopharyngeal Swab  Result Value Ref Range Status   SARS Coronavirus 2 NEGATIVE NEGATIVE Final    Comment: (NOTE) SARS-CoV-2 target nucleic acids are NOT DETECTED.  The SARS-CoV-2 RNA is generally detectable in upper and lower respiratory specimens during the acute phase of infection. The lowest concentration of SARS-CoV-2 viral copies this assay can detect is 250 copies / mL. A negative result does not preclude SARS-CoV-2 infection and should not be used as the sole basis for treatment or other patient management decisions.  A negative result may occur with improper specimen collection / handling, submission of specimen other than nasopharyngeal swab, presence of viral mutation(s) within the areas targeted by this assay, and inadequate number of viral copies (<250 copies / mL). A negative result must be combined with  clinical observations, patient history, and epidemiological information.  Fact Sheet for Patients:   StrictlyIdeas.no  Fact Sheet for Healthcare Providers: BankingDealers.co.za  This test is not yet approved or  cleared by the [place] FDA and has been authorized for detection and/or diagnosis of SARS-CoV-2 by FDA under an Emergency Use Authorization (EUA).  This EUA will remain in effect (meaning this test can be used) for the duration of the COVID-19 declaration under Section 564(b)(1) of the Act, 21 U.S.C. section 360bbb-3(b)(1), unless the authorization is terminated or revoked sooner.  Performed at [facility], [address] [place], [place] [address]   Blood culture (routine x 2)     Status: None (Preliminary result)   Collection Time: 11/08/2019  8:42 AM   Specimen: BLOOD LEFT HAND  Result Value Ref Range Status   Specimen Description BLOOD LEFT HAND  Final   Special Requests   Final    BOTTLES DRAWN AEROBIC AND ANAEROBIC Blood Culture adequate volume   Culture   Final    NO GROWTH 2 DAYS Performed at [facility], [address], [place], [place] [address]    Report Status PENDING  Incomplete    Radiology Reports CT Angio Chest PE W and/or Wo Contrast  Result Date: 11/21/2019 CLINICAL DATA:  Shortness of breath.  History of lung carcinoma EXAM: CT ANGIOGRAPHY CHEST WITH CONTRAST TECHNIQUE: Multidetector CT  imaging of the chest was performed using the standard protocol during bolus administration of intravenous contrast. Multiplanar CT image reconstructions and MIPs were obtained to evaluate the vascular anatomy. CONTRAST:  84m OMNIPAQUE IOHEXOL 350 MG/ML SOLN COMPARISON:  Chest radiograph November 09, 2019 FINDINGS: Cardiovascular: There is no demonstrable pulmonary embolus. There is no thoracic aortic aneurysm or dissection. There are scattered foci of calcification in visualized great vessels. There  are foci of aortic atherosclerosis. There are foci of coronary artery calcification. No pericardial effusion or pericardial thickening. Mediastinum/Nodes: No thyroid lesions evident. There is adenopathy in the aortopulmonary window region with the largest individual lymph node in this area measuring 1.7 x 1.4 cm. A confluence of lymph nodes in the superior left hilar region is noted measuring 2.3 x 1.6 cm. There is a lymph node anterior to the carina measuring 1.8 x 1.8 cm. There is a lymph node in the superior right hilum measuring 2.2 x 1.9 cm. Subcarinal lymph nodes are noted, largest measuring 1.9 x 1.4 cm. A lymph node anterior to the lower trachea measures 1.3 x 1.2 cm. No esophageal lesions are appreciable. Lungs/Pleura: There are free-flowing pleural effusions bilaterally. There is airspace consolidation consistent with pneumonia in the left lower lobe. There is also a degree of compressive atelectasis in each lower lobe. There are nodular opacities throughout the lungs bilaterally consistent with widespread metastatic disease. Several of these nodular opacities in the upper lobes show evidence of cavitation. Nodular opacities range in size from as small as 3 mm to as large as 1.5 x 1.5 cm. In the apices, there is fibrosis which is more severe on the right than on the left. Upper Abdomen: There is diffuse enlargement of the right adrenal with suspected masses within the right adrenal. There is upper abdominal aortic atherosclerosis. Musculoskeletal: There is degenerative change in the thoracic spine. No blastic or lytic bone lesions are evident. No chest wall lesions are appreciable. Review of the MIP images confirms the above findings. IMPRESSION: 1. No demonstrable pulmonary embolus. No thoracic aortic aneurysm or dissection. There is aortic atherosclerosis as well as foci of coronary artery calcification. 2. Moderate free-flowing pleural effusions bilaterally. Consolidation left lower lobe likely due to  pneumonia. There is compressive atelectasis in both lower lobes. 3. Multiple nodular lesions throughout the lungs consistent with metastatic foci. Several of these nodular opacities in the upper lobes show evidence of cavitation. 4.  Multiple foci of adenopathy. 5. Enlargement of right adrenal with questionable adrenal masses on the right. Suspect metastatic involvement of the right adrenal. 6. Fibrosis in the upper lobes, more severe on the right than the left. Aortic Atherosclerosis (ICD10-I70.0). Electronically Signed   By: WLowella GripIII M.D.   On: 11/20/2019 10:46   DG Chest Portable 1 View  Result Date: 11/03/2019 CLINICAL DATA:  Lung cancer, CHF, COPD, shortness of breath, hypoxia, new lung cancer diagnosis EXAM: PORTABLE CHEST 1 VIEW COMPARISON:  Portable exam 0905 hours compared to 12/15/2006 FINDINGS: Normal heart size and mediastinal contours. Enlargement of BILATERAL pulmonary hila question adenopathy. Patchy infiltrates are seen in both lungs which could represent pneumonia or edema. Multiple nodular appearing foci in both lungs concerning for pulmonary metastases. Small bibasilar effusions and atelectasis. No pneumothorax. Osseous structures unremarkable. IMPRESSION: BILATERAL pulmonary infiltrates and pleural effusions. Nodular foci in both lungs suspicious for pulmonary metastases in patient with reported new diagnosis of lung cancer. Suspected hilar adenopathy bilaterally. Correlation with CT chest with contrast recommended, if not previously performed elsewhere. Electronically Signed  By: Lavonia Dana M.D.   On: 11/16/2019 09:17    SIGNED: Deatra James, MD, FACP, FHM. [facility],  Pager (please use [email or web address] to page/text)  If 7PM-7AM, please contact night-coverage [email or web address] [email or web address] [facility] 11/11/2019, 10:48 AM

## 2019-11-11 NOTE — Progress Notes (Signed)
   11/11/19 1630  Clinical Encounter Type  Visited With Patient and family together  Visit Type Initial;Spiritual support;Social support  Referral From Nurse  Consult/Referral To Chaplain  Ch visited Pt per OR for PC. Pt and daughter in the room when I arrived. Daughter told me that Pt was done fine. I informed them that if they need me don't hesitate to call. Ch will follow-up.

## 2019-11-11 NOTE — Care Management Important Message (Signed)
Important Message  Patient Details  Name: Holly Reyes MRN: [id number] Date of Birth: 1945-08-30   Medicare Important Message Given:  Yes     Dannette Barbara 11/11/2019, 2:09 PM

## 2019-11-12 DIAGNOSIS — J189 Pneumonia, unspecified organism: Secondary | ICD-10-CM

## 2019-11-12 DIAGNOSIS — Z515 Encounter for palliative care: Secondary | ICD-10-CM

## 2019-11-12 MED ORDER — MORPHINE SULFATE (PF) 2 MG/ML IV SOLN: 1.0000 mg | INTRAVENOUS | Status: DC | PRN

## 2019-11-12 MED ORDER — MORPHINE 100MG IN NS 100ML (1MG/ML) PREMIX INFUSION
2.0000 mg/h | INTRAVENOUS | Status: DC
  Administered 2019-11-12: 2 mg/h via INTRAVENOUS
  Filled 2019-11-12: qty 100

## 2019-11-12 MED ORDER — FUROSEMIDE 10 MG/ML IJ SOLN
40.0000 mg | Freq: Two times a day (BID) | INTRAMUSCULAR | Status: DC
  Administered 2019-11-12: 40 mg via INTRAVENOUS
  Filled 2019-11-12: qty 4

## 2019-11-12 MED ORDER — LABETALOL HCL 100 MG PO TABS: 100.0000 mg | ORAL_TABLET | Freq: Two times a day (BID) | ORAL | Status: DC

## 2019-11-12 MED ORDER — ALBUMIN HUMAN 25 % IV SOLN: 25.0000 g | Freq: Once | INTRAVENOUS | Status: DC

## 2019-11-12 MED ORDER — AMLODIPINE BESYLATE 10 MG PO TABS: 10.0000 mg | ORAL_TABLET | Freq: Every day | ORAL | Status: DC

## 2019-11-12 NOTE — TOC Progression Note (Addendum)
Transition of Care [facility]) - Progression Note    Patient Details  Name: Holly Reyes MRN: [id number] Date of Birth: 1946-03-23  Transition of Care [facility]) CM/SW [place], RN Phone Number: 11/12/2019, 9:47 AM  Clinical Narrative:     Patient is being worked up by Hospice at [facility].  If patient meets requirements and a bed is available.  I will get her transferred possible tomorrow.  Patient is currently on comfort care measures.   Expected Discharge Plan: [place] Barriers to Discharge: Continued Medical Work up  Expected Discharge Plan and Services Expected Discharge Plan: [place] arrangements for the past 2 months: Apartment                                       Social Determinants of Health (SDOH) Interventions    Readmission Risk Interventions No flowsheet data found.

## 2019-11-12 NOTE — Progress Notes (Signed)
PROGRESS NOTE    Patient: Holly Reyes                            PCP: Philmore Pali, NP                    DOB: [birth year]            DOA: 11/15/2019 HGD:[id number]             DOS: 11/12/2019, 11:20 AM   LOS: 3 days   Date of Service: The patient was seen and examined on 11/12/2019  Subjective:   The patient was seen and examined this morning, found lethargic, but easily arousable, cooperative with exam.  Stating she is not feeling well but does not have a specific complaint of chest pain.  Continues to have shortness of breath, remains on 4 L of oxygen via nasal cannula, satting 100%    Brief Narrative:   Per HPI:  Holly Reyes is a 74 y.o. female with medical history significant for newly diagnosed metastatic adenocarcinoma of the lung, recent hospitalization at [facility] for postobstructive pneumonia who was sent to the ER from the  SNF again for where she stayed for worsening shortness of breath from her baseline.  Patient has been on 3 L--was satting 80%, O2 demand increased to 6 L via nasal cannula, was found lethargic,  with significant swelling to lower extremities, elevated blood pressure.  ED: She had a T-max of 100.5 F upon arrival to the ER, she was tachypneic and tachycardic.  Lactic acid is 0.5. Labs reveal sodium of 131, potassium of 3.8, chloride of 89, bicarb of 30, serum glucose of 56, BUN of 23, creatinine of 0.19, BNP of 31.3, troponin of 32, lactic acid of 0.5, white count of 11.9 with a left shift, hemoglobin of 10.   CT angiogram of the chest which showed moderate free-flowing pleural effusions bilaterally. Consolidation left lower lobe likely due to pneumonia. There is compressive atelectasis in both lower lobes.  Multiple nodular lesions throughout the lungs consistent with metastatic foci. Several of these nodular opacities in the upper lobes show evidence of cavitation. Multiple foci of adenopathy. Enlargement of right adrenal with questionable adrenal masses on the  right. Suspect metastatic involvement of the right adrenal.Fibrosis in the upper lobes, more severe on the right than the left. Chest x-ray reviewed by me shows bilateral pleural effusion and infiltrates. Twelve-lead EKG reviewed by me shows sinus tachycardia with PVCs.  On 6 L of oxygen to maintain pulse oximetry greater than 92%.  She will be admitted to the hospital for further evaluation.  Assessment & Plan:   Principal Problem:   HCAP (healthcare-associated pneumonia) Active Problems:   Essential hypertension   Nicotine dependence   Adenocarcinoma of lung, stage 4 ([place])   Sepsis ([place])   Sepsis from Healthcare associated pneumonia -She continues to improve, hemodynamically stable, O2 demand has improved from 6 L to 4 L of oxygen, currently satting 100%  Still complaining shortness of breath, worsening with exertion -Status post IV fluid resuscitation with sepsis protocol, continue antibiotics -DC'd IV fluid due to shortness of breath   -On arrival met sepsis criteria with T-max of 100.48F, tachycardia and tachypnea, mildly elevated white cell count of 11,000 with a left shift and left lower lobe pneumonia.  Lactic acid 0.5, procalcitonin 0.17, Probably postobstructive pneumonia  Imaging suggestive of a left lower lobe consolidation Patient was recently  treated at [facility] for same [facility] continue current antibiotic of azithromycin and cefepime, -We'll follow with cultures -Status post IV fluid resuscitation  Ethics -DNR/DNI -Palliative care team meeting with daughter today     Acute on chronic respiratory failure-worsening with recent hospitalization -Volume overload, anasarca -Continue diuretics, monitoring I's and O's, -1605.1,   -It appears patient was discharged from [facility] 2 weeks ago on 3 L of oxygen, now requiring 6 L, -Recurrent pneumonia, likely postobstructive -Worsening respiratory failure secondary to recurrent pneumonia, mets from lung CA -Poor  prognosis due to above -We'll maintain O2 sat greater than 90%-with supplement O2 by nasal cannula   -Patient had worsening shortness of breath overnight, along with worsening lower extremity edema due to IV fluid resuscitation for sepsis -IV fluids were DC'd, initiating IV Lasix, monitoring I's and O's and daily weight Continue DuoNeb, per RT     Essential hypertension Uncontrolled secondary to pain -Patient remains hypertensive, initiating Norvasc 10 mg p.o. daily, labetalol 100 mg p.o. twice daily - titrating for better BP control -As needed labetalol and hydralazine   Nicotine dependence Continue nicotine transdermal patch 21 mg daily    Metastatic lung cancer Patient was recently diagnosed with adenocarcinoma of the lungs with mets to liver, spleen, adrenal gland and T9 vertebral body.   She also has malignant pleural effusions -She has not started treatment yet -Pending palliative care consult  -had a meeting with the palliative care team -patient prognosis remain poor, DNR/DNI has been established, if no improvement patient and daughter agreed to pursue hospice home.    Diabetes mellitus Patient with blood sugar in the 50s on her chemistry -Diabetic diet, checking CBG QA CHS with SSI coverage -But holding home regimen   Hyperlipidemia Continue statin  Hypokalemia Monitoring repleting p.o. KCl  Depression -Continue Lexapro   Nutritional status:         Cultures; Blood Cultures x 2 >> NGT    Antimicrobials: - azithromycin and cefepime    Consultants: None   ----------------------------------------------------------------------------------------------------------------------------------------------  DVT prophylaxis:  SCD/Compression stockings and Lovenox SQ Code Status:   Code Status: DNR Family Communication: Daughter present at bedside-updated The above findings and plan of care has been discussed with patient (and family )  in  detail,  they expressed understanding and agreement of above. -Advance care planning has been discussed.   Admission status:    Status is: Inpatient  Remains inpatient appropriate because:Inpatient level of care appropriate due to severity of illness   Dispo: The patient is from: SNF              Anticipated d/c is to: SNF versus hospice home              Anticipated d/c date is: 3 days              Patient currently is not medically stable to d/c.        Procedures:   No admission procedures for hospital encounter.     Antimicrobials:  Anti-infectives (From admission, onward)   Start     Dose/Rate Route Frequency Ordered Stop   11/11/19 2200  azithromycin (ZITHROMAX) tablet 500 mg        500 mg Oral Daily 11/11/19 1155 11/14/19 2159   10/28/2019 2000  ceFEPIme (MAXIPIME) 2 g in sodium chloride 0.9 % 100 mL IVPB  Status:  Discontinued        2 g 200 mL/hr over 30 Minutes Intravenous Every 8 hours 10/23/2019 1541 11/11/19 1405  10/27/2019 1800  azithromycin (ZITHROMAX) 500 mg in sodium chloride 0.9 % 250 mL IVPB  Status:  Discontinued        500 mg 250 mL/hr over 60 Minutes Intravenous Every 24 hours 10/30/2019 1535 11/11/19 1155   11/18/2019 1115  ceFEPIme (MAXIPIME) 2 g in sodium chloride 0.9 % 100 mL IVPB        2 g 200 mL/hr over 30 Minutes Intravenous  Once 11/08/2019 1101 11/05/2019 1151   11/12/2019 1115  vancomycin (VANCOREADY) IVPB 2000 mg/400 mL        2,000 mg 200 mL/hr over 120 Minutes Intravenous  Once 11/21/2019 1103 10/29/2019 1402       Medication:  . amLODipine  10 mg Oral Daily  . azithromycin  500 mg Oral Daily  . clonazePAM  0.5 mg Oral BID  . escitalopram  10 mg Oral Daily  . furosemide  40 mg Intravenous BID  . guaiFENesin-dextromethorphan  10 mL Oral Q8H  . labetalol  100 mg Oral BID  . lidocaine  1 patch Transdermal Q24H  . melatonin  5 mg Oral QHS  . nicotine  14 mg Transdermal Daily  . sodium chloride flush  3 mL Intravenous Q12H    sodium  chloride, acetaminophen, albuterol, antiseptic oral rinse, fluticasone, glycopyrrolate, LORazepam, morphine injection, morphine CONCENTRATE **OR** morphine CONCENTRATE, ondansetron **OR** ondansetron (ZOFRAN) IV, oxyCODONE, polyethylene glycol, polyvinyl alcohol, senna, sodium chloride flush   Objective:   Vitals:   11/11/19 0759 11/11/19 1122 11/11/19 2041 11/12/19 0435  BP: (!) 169/84 (!) 183/73 (!) 192/80 (!) 189/74  Pulse: 89 90 99 94  Resp: _0 Temp: 98.4 F (36.9 C) 98.5 F (36.9 C) 98.7 F (37.1 C) 98.1 F (36.7 C)  TempSrc: Oral Oral Oral Oral  SpO2: 97% 98% 95% 100%  Weight:        Intake/Output Summary (Last 24 hours) at 11/12/2019 1120 Last data filed at 11/12/2019 0900 Gross per 24 hour  Intake 1434.92 ml  Output 2500 ml  Net -1065.08 ml   Filed Weights   11/03/2019 0833  Weight: 104.3 kg     Examination:       Physical Exam:   General:  Alert, oriented,  lethargic, following commands  HEENT:  Normocephalic, PERRL, otherwise with in Normal limits   Neuro:  CNII-XII intact. , normal motor and sensation, reflexes intact   Lungs:   Clear to auscultation BL, Respirations unlabored, no wheezes / crackles  Cardio:    S1/S2, RRR, No murmure, No Rubs or Gallops   Abdomen:   Soft, non-tender, bowel sounds active all four quadrants,  no guarding or peritoneal signs.  Muscular skeletal:  Limited exam - in bed, able to move all 4 extremities, Normal strength,  2+ pulses,  symmetric, ++ pitting edema  Skin:  Dry, warm to touch, negative for any Rashes, No open wounds  Wounds: Please see nursing documentation                      ------------------------------------------------------------------------------------------------------------------------------------------    LABs:  CBC Latest Ref Rng & Units 11/11/2019 11/10/2019 10/26/2019  WBC 4.0 - 10.5 K/uL 7.6 8.9 11.9(H)  Hemoglobin 12.0 - 15.0 g/dL 8.9(L) 8.7(L) 10.8(L)  Hematocrit 36 - 46 %  27.5(L) 28.0(L) 35.0(L)  Platelets 150 - 400 K/uL 93(L) 82(L) 100(L)   CMP Latest Ref Rng & Units 11/11/2019 11/10/2019 10/29/2019  Glucose 70 - 99 mg/dL 213(H) 306(H) 56(L)  BUN 8 - 23 mg/dL 20  23 23  Creatinine 0.44 - 1.00 mg/dL 0.90 0.90 0.92  Sodium 135 - 145 mmol/L 136 132(L) 131(L)  Potassium 3.5 - 5.1 mmol/L 4.9 4.2 3.8  Chloride 98 - 111 mmol/L 98 94(L) 89(L)  CO2 22 - 32 mmol/L _0 Calcium 8.9 - 10.3 mg/dL 8.2(L) 8.1(L) 8.9  Total Protein 6.5 - 8.1 g/dL - - 5.9(L)  Total Bilirubin 0.3 - 1.2 mg/dL - - 1.0  Alkaline Phos 38 - 126 U/L - - 73  AST 15 - 41 U/L - - 24  ALT 0 - 44 U/L - - 25       Micro Results Recent Results (from the past 240 hour(s))  Blood culture (routine x 2)     Status: None (Preliminary result)   Collection Time: 10/27/2019  8:35 AM   Specimen: BLOOD  Result Value Ref Range Status   Specimen Description BLOOD LEFT AC  Final   Special Requests   Final    BOTTLES DRAWN AEROBIC AND ANAEROBIC Blood Culture results may not be optimal due to an excessive volume of blood received in culture bottles   Culture   Final    NO GROWTH 3 DAYS Performed at [facility], [address], [place], [place] [address]    Report Status PENDING  Incomplete  SARS Coronavirus 2 by RT PCR (hospital order, performed in [place] hospital lab) Nasopharyngeal Nasopharyngeal Swab     Status: None   Collection Time: 11/04/2019  8:38 AM   Specimen: Nasopharyngeal Swab  Result Value Ref Range Status   SARS Coronavirus 2 NEGATIVE NEGATIVE Final    Comment: (NOTE) SARS-CoV-2 target nucleic acids are NOT DETECTED.  The SARS-CoV-2 RNA is generally detectable in upper and lower respiratory specimens during the acute phase of infection. The lowest concentration of SARS-CoV-2 viral copies this assay can detect is 250 copies / mL. A negative result does not preclude SARS-CoV-2 infection and should not be used as the sole basis for treatment or other patient management  decisions.  A negative result may occur with improper specimen collection / handling, submission of specimen other than nasopharyngeal swab, presence of viral mutation(s) within the areas targeted by this assay, and inadequate number of viral copies (<250 copies / mL). A negative result must be combined with clinical observations, patient history, and epidemiological information.  Fact Sheet for Patients:   StrictlyIdeas.no  Fact Sheet for Healthcare Providers: BankingDealers.co.za  This test is not yet approved or  cleared by the [place] FDA and has been authorized for detection and/or diagnosis of SARS-CoV-2 by FDA under an Emergency Use Authorization (EUA).  This EUA will remain in effect (meaning this test can be used) for the duration of the COVID-19 declaration under Section 564(b)(1) of the Act, 21 U.S.C. section 360bbb-3(b)(1), unless the authorization is terminated or revoked sooner.  Performed at [facility], [address] [place], [place] [address]   Blood culture (routine x 2)     Status: None (Preliminary result)   Collection Time: 11/11/2019  8:42 AM   Specimen: BLOOD LEFT HAND  Result Value Ref Range Status   Specimen Description BLOOD LEFT HAND  Final   Special Requests   Final    BOTTLES DRAWN AEROBIC AND ANAEROBIC Blood Culture adequate volume   Culture   Final    NO GROWTH 3 DAYS Performed at [facility], [address], [place], [place] [address]    Report Status PENDING  Incomplete  Radiology Reports CT Angio Chest PE W and/or Wo Contrast  Result Date: 11/22/2019 CLINICAL DATA:  Shortness of breath.  History of lung carcinoma EXAM: CT ANGIOGRAPHY CHEST WITH CONTRAST TECHNIQUE: Multidetector CT imaging of the chest was performed using the standard protocol during bolus administration of intravenous contrast. Multiplanar CT image reconstructions and MIPs were obtained to evaluate  the vascular anatomy. CONTRAST:  46m OMNIPAQUE IOHEXOL 350 MG/ML SOLN COMPARISON:  Chest radiograph November 09, 2019 FINDINGS: Cardiovascular: There is no demonstrable pulmonary embolus. There is no thoracic aortic aneurysm or dissection. There are scattered foci of calcification in visualized great vessels. There are foci of aortic atherosclerosis. There are foci of coronary artery calcification. No pericardial effusion or pericardial thickening. Mediastinum/Nodes: No thyroid lesions evident. There is adenopathy in the aortopulmonary window region with the largest individual lymph node in this area measuring 1.7 x 1.4 cm. A confluence of lymph nodes in the superior left hilar region is noted measuring 2.3 x 1.6 cm. There is a lymph node anterior to the carina measuring 1.8 x 1.8 cm. There is a lymph node in the superior right hilum measuring 2.2 x 1.9 cm. Subcarinal lymph nodes are noted, largest measuring 1.9 x 1.4 cm. A lymph node anterior to the lower trachea measures 1.3 x 1.2 cm. No esophageal lesions are appreciable. Lungs/Pleura: There are free-flowing pleural effusions bilaterally. There is airspace consolidation consistent with pneumonia in the left lower lobe. There is also a degree of compressive atelectasis in each lower lobe. There are nodular opacities throughout the lungs bilaterally consistent with widespread metastatic disease. Several of these nodular opacities in the upper lobes show evidence of cavitation. Nodular opacities range in size from as small as 3 mm to as large as 1.5 x 1.5 cm. In the apices, there is fibrosis which is more severe on the right than on the left. Upper Abdomen: There is diffuse enlargement of the right adrenal with suspected masses within the right adrenal. There is upper abdominal aortic atherosclerosis. Musculoskeletal: There is degenerative change in the thoracic spine. No blastic or lytic bone lesions are evident. No chest wall lesions are appreciable. Review of the  MIP images confirms the above findings. IMPRESSION: 1. No demonstrable pulmonary embolus. No thoracic aortic aneurysm or dissection. There is aortic atherosclerosis as well as foci of coronary artery calcification. 2. Moderate free-flowing pleural effusions bilaterally. Consolidation left lower lobe likely due to pneumonia. There is compressive atelectasis in both lower lobes. 3. Multiple nodular lesions throughout the lungs consistent with metastatic foci. Several of these nodular opacities in the upper lobes show evidence of cavitation. 4.  Multiple foci of adenopathy. 5. Enlargement of right adrenal with questionable adrenal masses on the right. Suspect metastatic involvement of the right adrenal. 6. Fibrosis in the upper lobes, more severe on the right than the left. Aortic Atherosclerosis (ICD10-I70.0). Electronically Signed   By: WLowella GripIII M.D.   On: 11/03/2019 10:46   DG Chest Portable 1 View  Result Date: 11/01/2019 CLINICAL DATA:  Lung cancer, CHF, COPD, shortness of breath, hypoxia, new lung cancer diagnosis EXAM: PORTABLE CHEST 1 VIEW COMPARISON:  Portable exam 0905 hours compared to 12/15/2006 FINDINGS: Normal heart size and mediastinal contours. Enlargement of BILATERAL pulmonary hila question adenopathy. Patchy infiltrates are seen in both lungs which could represent pneumonia or edema. Multiple nodular appearing foci in both lungs concerning for pulmonary metastases. Small bibasilar effusions and atelectasis. No pneumothorax. Osseous structures unremarkable. IMPRESSION: BILATERAL pulmonary infiltrates and pleural effusions. Nodular foci  in both lungs suspicious for pulmonary metastases in patient with reported new diagnosis of lung cancer. Suspected hilar adenopathy bilaterally. Correlation with CT chest with contrast recommended, if not previously performed elsewhere. Electronically Signed   By: Lavonia Dana M.D.   On: 11/14/2019 09:17    SIGNED: Deatra James, MD, FACP,  FHM. [facility],  Pager (please use [email or web address] to page/text)  If 7PM-7AM, please contact night-coverage [email or web address], Password [facility] 11/12/2019, 11:20 AM

## 2019-11-12 NOTE — Progress Notes (Signed)
Manufacturing engineer Documentation  Liaison received referral for pt to transfer to [facility] for EOL care.   Pt's chart in review by [facility] MD to determine eligibility. No bed availability today.   Liaison notes that pt may be a hospital death but will keep TOC updated re: bed availability tomorrow.  Thank you,  Freddie Breech, RN  [facility] Liaison [phone]

## 2019-11-12 NOTE — Progress Notes (Signed)
PMT no charge note  Chart reviewed, discussed with bedside RN Deneise Lever, corresponded with [facility] MD, discussed with palliative colleague  NP Lexine Baton, discussed with daughter Ms Wynetta Emery.   Comfort remains singular focus. Will initiate Morphine drip as well as Morphine bolus IV PRN. Continue other comfort care measures. Anticipate hospital death, prognosis ?hours to some very limited number of days.   No charge  Loistine Chance MD [facility] health palliative [phone].

## 2019-11-12 NOTE — Progress Notes (Signed)
°  Per nursing request reached out to palliative care team as below.Alda Lea, AGPCNP-BC  Palliative Medicine Team   [phone]   The number was called few times no answer, message was left Needing order clarification regarding comfort care measures.  Dr. Roger Shelter

## 2019-11-13 MED ORDER — METOPROLOL TARTRATE 50 MG PO TABS: 100.0000 mg | ORAL_TABLET | Freq: Two times a day (BID) | ORAL | Status: DC

## 2019-11-14 LAB — CULTURE, BLOOD (ROUTINE X 2)
Culture: NO GROWTH
Culture: NO GROWTH
Special Requests: ADEQUATE

## 2019-11-23 NOTE — Progress Notes (Signed)
CH received verbal referral to support family as pt. has declined this AM; when [facility] entered the room, pt.'s dtr., niece, grand niece, son in law, and ex sister in law all present at bedside; family requested prayer;  as [place] prayed for God's strength and comfort for family and committed pt. to God's grace and love, family members became tearful with anticipatory grief.  CH provided extra tissues; judged family were supporting one another sufficiently and excused himself after making family aware of chaplains' availability.

## 2019-11-23 NOTE — Progress Notes (Addendum)
Notified by case management that bed is available at hospice home in [place]. Family stated they would like to keep patient at hospital. Patients daughter stated she is very happy with the care her mother has received here. Patient given bed bath, turned to side. Requested robinul to assist with secretions. Emotional support given to patients daughter and family at bedside. Morphine drip increased to 6mg /hr for comfort.

## 2019-11-23 NOTE — Progress Notes (Signed)
Ch visited with family after being paged by RN about Pt's passing. [place] provided ministry of pastoral presence to tearful family members. Family requested prayer. Ch prayed with them, and let them know about further chaplain availability for support if needed.

## 2019-11-23 NOTE — Progress Notes (Signed)
Manufacturing engineer [facility]) Hospital Liaison note.    Made CM [place] aware that [facility] was able to offer Ms Mahler a room today. Unfortunately pt too unstable per report to transfer.  Pt will continue to receive end of life care at [facility].  Please do not hesitate to call with questions.    Thank you for the opportunity to participate in this patient's care.  Chrislyn Edison Pace, BSN, RN [place] (listed on [place] under Hospice[facility])    [phone]

## 2019-11-23 NOTE — Progress Notes (Signed)
Patient having little relief after giving PRN medications for shortness of breath, labored breathing. This RN paged MD, paged palliative. Palliative placed new order for morphine drip for comfort. Daughter at bedside made aware.

## 2019-11-23 NOTE — Progress Notes (Signed)
Patient expired at 11:02am on 2019/11/25. Pronounced by Dereck Leep RN, Thomas Hoff RN. MD paged and notified. Patients family at bedside.

## 2019-11-23 NOTE — Death Summary Note (Signed)
Death Summary  TYJA GORTNEY ZHG:[id number] DOB: 07/24/1945 DOA: December 03, 2019  PCP: Philmore Pali, NP  Admit date: 2019/12/03 Date of Death: Dec 07, 2019 Time of Death: 1100-06-24 Notification: Philmore Pali, NP notified of death of December 07, 2019   History of present illness:  Holly Reyes is a 74 y.o. female with a history of  Holly Reyes presented with complaint of  Holly Reyes did not improve after aggressive treatment for sepsis-based on protocol.  Code Status= DNR/DNI  Brief Summary/Hospital Stay:  Holly Reyes a 73 y.o.femalewith medical history significant fornewly diagnosed metastatic adenocarcinoma of the lung,recent hospitalization at [facility] for postobstructive pneumonia who was sent to the ER from the  SNF again for where she stayed for worsening shortness of breath from her baseline. Patient has been on 3 L--was satting 80%, O2 demand increased to 6 L via nasal cannula, was found lethargic,  with significant swelling to lower extremities, elevated blood pressure.  Patient was subsequently admitted under diagnosis of sepsis likely due to obstructive pneumonia due to metastatic lung cancer.  Patient was initially started on O2 supplements, IV antibiotics, IV fluids. Patient respiratory status progressively gotten worse. Palliative care was consulted-family and patient progressed and agreed to comfort care measure  Patient subsequently declined and was deceased on 12-07-19 at 11:02 AM.   On physical exam: Patient was unresponsive to verbal and physical stimuli.  No gag reflex, pupillary reflex or corneal reflex was present.  Heart sounds and breath sounds were absent.  Final/Principal Diagnoses:  1.   Sepsis 2.  Sepsis due to likely obstructive pneumonia due to cancer 3.  Metastatic lung cancer  Disposition/Follow up Care: Patient is deceased.   Discharge medications: None  The results of significant diagnostics from this hospitalization (including imaging,  microbiology, ancillary and laboratory) are listed below for reference.    Significant Diagnostic Studies: CT Angio Chest PE W and/or Wo Contrast  Result Date: Dec 03, 2019 CLINICAL DATA:  Shortness of breath.  History of lung carcinoma EXAM: CT ANGIOGRAPHY CHEST WITH CONTRAST TECHNIQUE: Multidetector CT imaging of the chest was performed using the standard protocol during bolus administration of intravenous contrast. Multiplanar CT image reconstructions and MIPs were obtained to evaluate the vascular anatomy. CONTRAST:  64mL OMNIPAQUE IOHEXOL 350 MG/ML SOLN COMPARISON:  Chest radiograph 12/03/19 FINDINGS: Cardiovascular: There is no demonstrable pulmonary embolus. There is no thoracic aortic aneurysm or dissection. There are scattered foci of calcification in visualized great vessels. There are foci of aortic atherosclerosis. There are foci of coronary artery calcification. No pericardial effusion or pericardial thickening. Mediastinum/Nodes: No thyroid lesions evident. There is adenopathy in the aortopulmonary window region with the largest individual lymph node in this area measuring 1.7 x 1.4 cm. A confluence of lymph nodes in the superior left hilar region is noted measuring 2.3 x 1.6 cm. There is a lymph node anterior to the carina measuring 1.8 x 1.8 cm. There is a lymph node in the superior right hilum measuring 2.2 x 1.9 cm. Subcarinal lymph nodes are noted, largest measuring 1.9 x 1.4 cm. A lymph node anterior to the lower trachea measures 1.3 x 1.2 cm. No esophageal lesions are appreciable. Lungs/Pleura: There are free-flowing pleural effusions bilaterally. There is airspace consolidation consistent with pneumonia in the left lower lobe. There is also a degree of compressive atelectasis in each lower lobe. There are nodular opacities throughout the lungs bilaterally consistent with widespread metastatic disease. Several of these nodular opacities in the upper lobes show evidence  of cavitation.  Nodular opacities range in size from as small as 3 mm to as large as 1.5 x 1.5 cm. In the apices, there is fibrosis which is more severe on the right than on the left. Upper Abdomen: There is diffuse enlargement of the right adrenal with suspected masses within the right adrenal. There is upper abdominal aortic atherosclerosis. Musculoskeletal: There is degenerative change in the thoracic spine. No blastic or lytic bone lesions are evident. No chest wall lesions are appreciable. Review of the MIP images confirms the above findings. IMPRESSION: 1. No demonstrable pulmonary embolus. No thoracic aortic aneurysm or dissection. There is aortic atherosclerosis as well as foci of coronary artery calcification. 2. Moderate free-flowing pleural effusions bilaterally. Consolidation left lower lobe likely due to pneumonia. There is compressive atelectasis in both lower lobes. 3. Multiple nodular lesions throughout the lungs consistent with metastatic foci. Several of these nodular opacities in the upper lobes show evidence of cavitation. 4.  Multiple foci of adenopathy. 5. Enlargement of right adrenal with questionable adrenal masses on the right. Suspect metastatic involvement of the right adrenal. 6. Fibrosis in the upper lobes, more severe on the right than the left. Aortic Atherosclerosis (ICD10-I70.0). Electronically Signed   By: Lowella Grip III M.D.   On: 11/04/2019 10:46   DG Chest Portable 1 View  Result Date: 11/10/2019 CLINICAL DATA:  Lung cancer, CHF, COPD, shortness of breath, hypoxia, new lung cancer diagnosis EXAM: PORTABLE CHEST 1 VIEW COMPARISON:  Portable exam 0905 hours compared to 12/15/2006 FINDINGS: Normal heart size and mediastinal contours. Enlargement of BILATERAL pulmonary hila question adenopathy. Patchy infiltrates are seen in both lungs which could represent pneumonia or edema. Multiple nodular appearing foci in both lungs concerning for pulmonary metastases. Small bibasilar effusions and  atelectasis. No pneumothorax. Osseous structures unremarkable. IMPRESSION: BILATERAL pulmonary infiltrates and pleural effusions. Nodular foci in both lungs suspicious for pulmonary metastases in patient with reported new diagnosis of lung cancer. Suspected hilar adenopathy bilaterally. Correlation with CT chest with contrast recommended, if not previously performed elsewhere. Electronically Signed   By: Lavonia Dana M.D.   On: 11/08/2019 09:17    Microbiology: Recent Results (from the past 240 hour(s))  Blood culture (routine x 2)     Status: None (Preliminary result)   Collection Time: 10/23/2019  8:35 AM   Specimen: BLOOD  Result Value Ref Range Status   Specimen Description BLOOD LEFT AC  Final   Special Requests   Final    BOTTLES DRAWN AEROBIC AND ANAEROBIC Blood Culture results may not be optimal due to an excessive volume of blood received in culture bottles   Culture   Final    NO GROWTH 4 DAYS Performed at [facility], [address] [place], [place] [address]    Report Status PENDING  Incomplete  SARS Coronavirus 2 by RT PCR (hospital order, performed in [place] hospital lab) Nasopharyngeal Nasopharyngeal Swab     Status: None   Collection Time: 10/28/2019  8:38 AM   Specimen: Nasopharyngeal Swab  Result Value Ref Range Status   SARS Coronavirus 2 NEGATIVE NEGATIVE Final    Comment: (NOTE) SARS-CoV-2 target nucleic acids are NOT DETECTED.  The SARS-CoV-2 RNA is generally detectable in upper and lower respiratory specimens during the acute phase of infection. The lowest concentration of SARS-CoV-2 viral copies this assay can detect is 250 copies / mL. A negative result does not preclude SARS-CoV-2 infection and should not be used as the sole basis for treatment  or other patient management decisions.  A negative result may occur with improper specimen collection / handling, submission of specimen other than nasopharyngeal swab, presence of viral mutation(s) within  the areas targeted by this assay, and inadequate number of viral copies (<250 copies / mL). A negative result must be combined with clinical observations, patient history, and epidemiological information.  Fact Sheet for Patients:   StrictlyIdeas.no  Fact Sheet for Healthcare Providers: BankingDealers.co.za  This test is not yet approved or  cleared by the [place] FDA and has been authorized for detection and/or diagnosis of SARS-CoV-2 by FDA under an Emergency Use Authorization (EUA).  This EUA will remain in effect (meaning this test can be used) for the duration of the COVID-19 declaration under Section 564(b)(1) of the Act, 21 U.S.C. section 360bbb-3(b)(1), unless the authorization is terminated or revoked sooner.  Performed at [facility], [address] [place], [place] [address]   Blood culture (routine x 2)     Status: None (Preliminary result)   Collection Time: 11/19/2019  8:42 AM   Specimen: BLOOD LEFT HAND  Result Value Ref Range Status   Specimen Description BLOOD LEFT HAND  Final   Special Requests   Final    BOTTLES DRAWN AEROBIC AND ANAEROBIC Blood Culture adequate volume   Culture   Final    NO GROWTH 4 DAYS Performed at [facility], [address] [place], [place] [address]    Report Status PENDING  Incomplete     Labs: Basic Metabolic Panel: Recent Labs  Lab 10/23/2019 0838 11/08/2019 0838 11/10/19 0743 11/11/19 0453  NA 131*  --  132* 136  K 3.8   < > 4.2 4.9  CL 89*  --  94* 98  CO2 30  --  27 30  GLUCOSE 56*  --  306* 213*  BUN 23  --  23 20  CREATININE 0.92  --  0.90 0.90  CALCIUM 8.9  --  8.1* 8.2*  MG 2.0  --   --   --    < > = values in this interval not displayed.   Liver Function Tests: Recent Labs  Lab 10/24/2019 0838  AST 24  ALT 25  ALKPHOS 73  BILITOT 1.0  PROT 5.9*  ALBUMIN 2.9*   No results for input(s): LIPASE, AMYLASE in the last 168 hours. No  results for input(s): AMMONIA in the last 168 hours. CBC: Recent Labs  Lab 10/29/2019 0838 11/10/19 0800 11/11/19 0453  WBC 11.9* 8.9 7.6  NEUTROABS 10.1*  --   --   HGB 10.8* 8.7* 8.9*  HCT 35.0* 28.0* 27.5*  MCV 87.9 87.8 86.2  PLT 100* 82* 93*   Cardiac Enzymes: No results for input(s): CKTOTAL, CKMB, CKMBINDEX, TROPONINI in the last 168 hours. D-Dimer No results for input(s): DDIMER in the last 72 hours. BNP: Invalid input(s): POCBNP CBG: Recent Labs  Lab 11/10/19 1537 11/10/19 1732 11/10/19 2200 11/11/19 0801 11/11/19 1124  GLUCAP 244* 241* 180* 185* 169*   Anemia work up No results for input(s): VITAMINB12, FOLATE, FERRITIN, TIBC, IRON, RETICCTPCT in the last 72 hours. Urinalysis    Component Value Date/Time   COLORURINE YELLOW (A) 11/15/2019 0838   APPEARANCEUR CLEAR (A) 11/10/2019 0838   LABSPEC 1.012 11/03/2019 0838   PHURINE 6.0 10/28/2019 0838   GLUCOSEU NEGATIVE 11/14/2019 0838   HGBUR SMALL (A) 11/21/2019 0838   BILIRUBINUR NEGATIVE 11/02/2019 [place] 10/24/2019 0838   PROTEINUR 100 (A) 11/21/2019 0981  NITRITE NEGATIVE 10/23/2019 0838   LEUKOCYTESUR NEGATIVE 10/29/2019 0838   Sepsis Labs Invalid input(s): PROCALCITONIN,  WBC,  LACTICIDVEN  I have spent 30  minutes face to face encounter with the patient and on the ward discussing the patients care, assessment, plan and disposition with other care givers. >50% of the time was devoted  coordinating care.    SIGNED:  Deatra James, MD  [facility] 2019/11/27, 11:20 AM Pager   If 7PM-7AM, please contact night-coverage [email or web address] Password [email or web address]

## 2019-11-23 NOTE — Progress Notes (Signed)
[place] recieved page from 2A Rn asking on behalf of the pt.'s dtr. about notarization of POA paperwork; when [facility] entered rm., pt. lying down apparently asleep w/dtr. at bedside.  Dtr. wanted to kow if [place] could notarize a general POA document --> [facility] said chaplains are not notaries but other staff may be available.  Dtr. also presented a '5 Wishes' MPOA/Living Will document which [place] took a copy of to scan.  Notary available but unwilling to notarize general POA paperwork as they do not feel comfortable with this form.  [place] copied AD and placed in chart, scanned copy to [facility].  Pt.'s AD contains detailed description of pt.'s wishes for EOL life care, beyond what [facility] AD usually includes.  Pt. is on comfort care; may benefit from follow-up support.

## 2019-11-23 DEATH — deceased

## 2019-12-12 ENCOUNTER — Ambulatory Visit (INDEPENDENT_AMBULATORY_CARE_PROVIDER_SITE_OTHER): Payer: Medicare HMO | Admitting: Vascular Surgery

## 2019-12-12 ENCOUNTER — Encounter (INDEPENDENT_AMBULATORY_CARE_PROVIDER_SITE_OTHER): Payer: Medicare HMO
